# Patient Record
Sex: Female | Born: 1970 | Race: Black or African American | Hispanic: No | Marital: Single | Smoking: Never smoker
Health system: Southern US, Community
[De-identification: ages and names within clinical notes are randomized; demographics above are authoritative.]

## PROBLEM LIST (undated history)

## (undated) DIAGNOSIS — F419 Anxiety disorder, unspecified: Secondary | ICD-10-CM

## (undated) DIAGNOSIS — R519 Headache, unspecified: Secondary | ICD-10-CM

## (undated) DIAGNOSIS — N189 Chronic kidney disease, unspecified: Secondary | ICD-10-CM

## (undated) DIAGNOSIS — K219 Gastro-esophageal reflux disease without esophagitis: Secondary | ICD-10-CM

## (undated) DIAGNOSIS — G8929 Other chronic pain: Secondary | ICD-10-CM

## (undated) DIAGNOSIS — Z87442 Personal history of urinary calculi: Secondary | ICD-10-CM

## (undated) DIAGNOSIS — J45909 Unspecified asthma, uncomplicated: Secondary | ICD-10-CM

## (undated) DIAGNOSIS — G4730 Sleep apnea, unspecified: Secondary | ICD-10-CM

## (undated) DIAGNOSIS — R51 Headache: Secondary | ICD-10-CM

## (undated) DIAGNOSIS — D72819 Decreased white blood cell count, unspecified: Secondary | ICD-10-CM

## (undated) DIAGNOSIS — T7840XA Allergy, unspecified, initial encounter: Secondary | ICD-10-CM

## (undated) DIAGNOSIS — K5900 Constipation, unspecified: Secondary | ICD-10-CM

## (undated) HISTORY — DX: Constipation, unspecified: K59.00

## (undated) HISTORY — DX: Headache: R51

## (undated) HISTORY — DX: Allergy, unspecified, initial encounter: T78.40XA

## (undated) HISTORY — DX: Sleep apnea, unspecified: G47.30

## (undated) HISTORY — DX: Headache, unspecified: R51.9

## (undated) HISTORY — DX: Anxiety disorder, unspecified: F41.9

## (undated) HISTORY — DX: Other chronic pain: G89.29

## (undated) HISTORY — PX: OTHER SURGICAL HISTORY: SHX169

## (undated) HISTORY — DX: Chronic kidney disease, unspecified: N18.9

## (undated) HISTORY — DX: Decreased white blood cell count, unspecified: D72.819

## (undated) HISTORY — PX: ABDOMINAL HYSTERECTOMY: SHX81

## (undated) HISTORY — PX: COLONOSCOPY: SHX174

---

## 2011-07-28 ENCOUNTER — Encounter (HOSPITAL_COMMUNITY): Payer: Self-pay | Admitting: *Deleted

## 2011-07-28 ENCOUNTER — Emergency Department (HOSPITAL_COMMUNITY)
Admission: EM | Admit: 2011-07-28 | Discharge: 2011-07-29 | Disposition: A | Discharge status: Home / Self Care | Payer: Managed Care, Other (non HMO) | Attending: Emergency Medicine | Admitting: Emergency Medicine

## 2011-07-28 DIAGNOSIS — S139XXA Sprain of joints and ligaments of unspecified parts of neck, initial encounter: Secondary | ICD-10-CM | POA: Insufficient documentation

## 2011-07-28 DIAGNOSIS — S161XXA Strain of muscle, fascia and tendon at neck level, initial encounter: Secondary | ICD-10-CM

## 2011-07-28 DIAGNOSIS — M542 Cervicalgia: Secondary | ICD-10-CM | POA: Insufficient documentation
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
## 2011-07-29 ENCOUNTER — Emergency Department (HOSPITAL_COMMUNITY): Payer: Managed Care, Other (non HMO)

## 2011-07-29 MED ORDER — NAPROXEN 500 MG PO TABS: 500.0000 mg | ORAL_TABLET | Freq: Two times a day (BID) | ORAL | Status: DC

## 2011-07-29 MED ORDER — CYCLOBENZAPRINE HCL 10 MG PO TABS: 10.0000 mg | ORAL_TABLET | Freq: Three times a day (TID) | ORAL | Status: AC | PRN
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
## 2011-07-29 NOTE — ED Provider Notes (Signed)
Medical screening examination/treatment/procedure(s) were performed by non-physician practitioner and as supervising physician I was immediately available for consultation/collaboration.   Halie Gass B. Tya Haughey, MD 07/29/11 0657 

## 2011-09-16 ENCOUNTER — Emergency Department (HOSPITAL_COMMUNITY)
Admission: EM | Admit: 2011-09-16 | Discharge: 2011-09-16 | Disposition: A | Discharge status: Home / Self Care | Payer: Self-pay | Attending: Emergency Medicine | Admitting: Emergency Medicine

## 2011-09-16 ENCOUNTER — Encounter (HOSPITAL_COMMUNITY): Payer: Self-pay | Admitting: *Deleted

## 2011-09-16 DIAGNOSIS — K219 Gastro-esophageal reflux disease without esophagitis: Secondary | ICD-10-CM | POA: Insufficient documentation

## 2011-09-16 HISTORY — DX: Gastro-esophageal reflux disease without esophagitis: K21.9

## 2011-09-16 MED ORDER — ONDANSETRON 8 MG PO TBDP
8.0000 mg | ORAL_TABLET | Freq: Once | ORAL | Status: AC
  Administered 2011-09-16: 8 mg via ORAL
  Filled 2011-09-16: qty 1

## 2011-09-16 MED ORDER — PANTOPRAZOLE SODIUM 40 MG PO TBEC
40.0000 mg | DELAYED_RELEASE_TABLET | Freq: Every day | ORAL | Status: DC
  Administered 2011-09-16: 40 mg via ORAL
  Filled 2011-09-16: qty 1

## 2011-09-16 MED ORDER — FAMOTIDINE 20 MG PO TABS: 20.0000 mg | ORAL_TABLET | Freq: Two times a day (BID) | ORAL | Status: DC

## 2011-09-16 NOTE — ED Notes (Signed)
Pt states she used to take Prilosec for reflux, but hasn't taken it in a while.

## 2011-09-16 NOTE — ED Provider Notes (Signed)
History     CSN: [id number]  Arrival date & time 09/16/11  1814   First MD Initiated Contact with Patient 09/16/11 2120      Chief Complaint  Patient presents with  . Gastrophageal Reflux    (Consider location/radiation/quality/duration/timing/severity/associated sxs/prior treatment) HPI Comments: Melinda Freeman 41 y.o. female   The chief complaint is: Patient presents with:   Gastrophageal Reflux   The patient has medical history significant for:   Past Medical History:   GERD (gastroesophageal reflux disease)                       Patient presents with epigastric abdominal pain and one episode of blood tinged emesis. Patient states that she used to take Prilosec but stopped for monetary reasons. Patient states she take Tums frequently without relief. Denies fever or chills. Denies hematochezia, melena, or change in bowel pattern.        The history is provided by the patient.    Past Medical History  Diagnosis Date  . GERD (gastroesophageal reflux disease)     Past Surgical History  Procedure Date  . Abdominal hysterectomy     History reviewed. No pertinent family history.  History  Substance Use Topics  . Smoking status: Never Smoker   . Smokeless tobacco: Never Used  . Alcohol Use: Yes    OB History    Grav Para Term Preterm Abortions TAB SAB Ect Mult Living                  Review of Systems  Constitutional: Negative for fever and chills.  Gastrointestinal: Positive for nausea, vomiting and abdominal pain. Negative for blood in stool.  All other systems reviewed and are negative.    Allergies  Codeine; Hydrocodone; and Percocet  Home Medications   Current Outpatient Rx  Name Route Sig Dispense Refill  . ASPIRIN-ACETAMINOPHEN-CAFFEINE 250-250-65 MG PO TABS Oral Take 2 tablets by mouth every 6 (six) hours as needed. Headache    . OMEPRAZOLE 20 MG PO CPDR Oral Take 20 mg by mouth daily.      BP 127/68  Pulse 80  Temp 98 F (36.7 C)  (Oral)  Resp 18  SpO2 100%  Physical Exam  Nursing note and vitals reviewed. Constitutional: She appears well-developed and well-nourished. No distress.  HENT:  Head: Normocephalic and atraumatic.  Mouth/Throat: Oropharynx is clear and moist.  Eyes: Conjunctivae and EOM are normal. No scleral icterus.  Neck: Normal range of motion. Neck supple.  Abdominal: Soft. Bowel sounds are normal. There is tenderness.       Tenderness to palpation of the epigastrium.  Neurological: She is alert.  Skin: Skin is warm and dry.    ED Course  Procedures (including critical care time)  Labs Reviewed - No data to display No results found.   1. GERD (gastroesophageal reflux disease)       MDM  Patient presented for acute treatment of GERD. Patient given omeprazole in ED with improvement. Patient discharged on famotidine as per her request she wanted an Rx on the 4 dollar list at [facility]. Return precautions given verbally and in discharge summary. No red flags for peptic ulcer perforation. Patient encouraged to visit a GI specialist as she had an EGD in the past and may need further evaluation.        Pixie Casino, PA-C 09/17/11 [phone]

## 2011-09-16 NOTE — ED Notes (Signed)
Pt states reflux is getting worse. Pt states she tasted blood today. Pt states she had pinkish tinged sputum today.

## 2011-09-16 NOTE — ED Notes (Signed)
Pt presents to the ED with c/o of chronic acid reflux.  Pt reports epigastric pain that burns. Pt eating Doritos and drinking water prior to assessment. Pt states she vomited a tiny amount of blood today.

## 2011-09-20 NOTE — ED Provider Notes (Signed)
Medical screening examination/treatment/procedure(s) were performed by non-physician practitioner and as supervising physician I was immediately available for consultation/collaboration.  Yuvia Plant K Linker, MD 09/20/11 1719 

## 2012-09-19 ENCOUNTER — Emergency Department (INDEPENDENT_AMBULATORY_CARE_PROVIDER_SITE_OTHER)
Admission: EM | Admit: 2012-09-19 | Discharge: 2012-09-19 | Disposition: A | Discharge status: Home / Self Care | Payer: PRIVATE HEALTH INSURANCE | Source: Home / Self Care

## 2012-09-19 ENCOUNTER — Encounter (HOSPITAL_COMMUNITY): Payer: Self-pay | Admitting: Emergency Medicine

## 2012-09-19 DIAGNOSIS — T148 Other injury of unspecified body region: Secondary | ICD-10-CM

## 2012-09-19 DIAGNOSIS — W57XXXA Bitten or stung by nonvenomous insect and other nonvenomous arthropods, initial encounter: Secondary | ICD-10-CM

## 2012-09-19 MED ORDER — TRIAMCINOLONE ACETONIDE 0.1 % EX OINT: TOPICAL_OINTMENT | Freq: Two times a day (BID) | CUTANEOUS | Status: DC

## 2012-09-19 NOTE — ED Provider Notes (Signed)
CSN: [id number]     Arrival date & time 09/19/12  1638 History   First MD Initiated Contact with Patient 09/19/12 1723     Chief Complaint  Patient presents with  . Insect Bite    spider bite upper right thigh   (Consider location/radiation/quality/duration/timing/severity/associated sxs/prior Treatment) HPI Comments: 42 year old female presents complaining of  spider bite to her upper outer right thigh. 2 days ago, she saw a spider on her leg and she squished it. She did not immediately have any problems, but the next day she started to have some redness and itching in that area. Today, the redness has gone down some but the entire area is slightly swollen. Is completely nontender and is not painful at all. She has no fever, chills, or any problems elsewhere. No systemic symptoms, she feels fine and does not feel sick.   Past Medical History  Diagnosis Date  . GERD (gastroesophageal reflux disease)    Past Surgical History  Procedure Laterality Date  . Abdominal hysterectomy     History reviewed. No pertinent family history. History  Substance Use Topics  . Smoking status: Never Smoker   . Smokeless tobacco: Never Used  . Alcohol Use: Yes   OB History   Grav Para Term Preterm Abortions TAB SAB Ect Mult Living                 Review of Systems  Constitutional: Negative for fever and chills.  Eyes: Negative for visual disturbance.  Respiratory: Negative for cough and shortness of breath.   Cardiovascular: Negative for chest pain, palpitations and leg swelling.  Gastrointestinal: Negative for nausea, vomiting and abdominal pain.  Endocrine: Negative for polydipsia and polyuria.  Genitourinary: Negative for dysuria, urgency and frequency.  Musculoskeletal: Negative for myalgias and arthralgias.  Skin: Positive for rash (see history of present illness regarding lesion).  Neurological: Negative for dizziness, weakness and light-headedness.    Allergies  Codeine; Hydrocodone;  and Percocet  Home Medications   Current Outpatient Rx  Name  Route  Sig  Dispense  Refill  . aspirin-acetaminophen-caffeine (EXCEDRIN MIGRAINE) 250-250-65 MG per tablet   Oral   Take 2 tablets by mouth every 6 (six) hours as needed. Headache         . EXPIRED: famotidine (PEPCID) 20 MG tablet   Oral   Take 1 tablet (20 mg total) by mouth 2 (two) times daily.   30 tablet   0   . omeprazole (PRILOSEC) 20 MG capsule   Oral   Take 20 mg by mouth daily.         Marland Kitchen triamcinolone ointment (KENALOG) 0.1 %   Topical   Apply topically 2 (two) times daily.   30 g   0    BP 131/90  Pulse 80  Temp(Src) 98.9 F (37.2 C) (Oral)  Resp 16  SpO2 98% Physical Exam  Nursing note and vitals reviewed. Constitutional: She is oriented to person, place, and time. She appears well-developed and well-nourished. No distress.  HENT:  Head: Normocephalic and atraumatic.  Pulmonary/Chest: Effort normal. No respiratory distress.  Neurological: She is alert and oriented to person, place, and time. No cranial nerve deficit.  Skin: She is not diaphoretic.  Area of erythema and swelling on the upper outer right thigh, without induration or tenderness. Not warm to touch.  Psychiatric: She has a normal mood and affect. Judgment normal.    ED Course  Procedures (including critical care time) Labs Review Labs Reviewed - No  data to display Imaging Review No results found.  MDM   1. Insect bite    This is a local inflammatory reaction, I do not believe that this area is infected. She will take it daily allergy medicine such as Zyrtec and also triamcinolone ointment. Area outlined with a marker, she will keep an eye on it and return for any increasing pain or redness.  Meds ordered this encounter  Medications  . triamcinolone ointment (KENALOG) 0.1 %    Sig: Apply topically 2 (two) times daily.    Dispense:  30 g    Refill:  0     Graylon Good, PA-C 09/19/12 2041

## 2012-09-19 NOTE — ED Notes (Signed)
C/o spider bite to upper right thigh. Noticed on Saturday. Redness and swelling. Itching started today. Denies fever and drainage.  Pt has tried soaking in warm bath with no relief.

## 2012-09-21 NOTE — ED Provider Notes (Signed)
Medical screening examination/treatment/procedure(s) were performed by a resident physician or non-physician practitioner and as the supervising physician I was immediately available for consultation/collaboration.  Clementeen Graham, MD   Rodolph Bong, MD 09/21/12 [phone]

## 2013-02-19 ENCOUNTER — Ambulatory Visit (INDEPENDENT_AMBULATORY_CARE_PROVIDER_SITE_OTHER): Payer: BC Managed Care – PPO | Admitting: Obstetrics & Gynecology

## 2013-02-19 ENCOUNTER — Encounter: Payer: Self-pay | Admitting: Obstetrics & Gynecology

## 2013-02-19 VITALS — BP 132/89 | HR 76 | Temp 98.2°F | Ht 65.0 in | Wt 154.0 lb

## 2013-02-19 DIAGNOSIS — Z0000 Encounter for general adult medical examination without abnormal findings: Secondary | ICD-10-CM

## 2013-02-19 DIAGNOSIS — Z23 Encounter for immunization: Secondary | ICD-10-CM

## 2013-02-19 DIAGNOSIS — Z01419 Encounter for gynecological examination (general) (routine) without abnormal findings: Secondary | ICD-10-CM

## 2013-02-19 LAB — CBC WITH DIFFERENTIAL/PLATELET
BASOS ABS: 0.1 10*3/uL (ref 0.0–0.1)
Basophils Relative: 1 % (ref 0–1)
EOS PCT: 2 % (ref 0–5)
Eosinophils Absolute: 0.1 10*3/uL (ref 0.0–0.7)
HEMATOCRIT: 42.1 % (ref 36.0–46.0)
Hemoglobin: 14.3 g/dL (ref 12.0–15.0)
LYMPHS ABS: 2.2 10*3/uL (ref 0.7–4.0)
LYMPHS PCT: 36 % (ref 12–46)
MCH: 31.7 pg (ref 26.0–34.0)
MCHC: 34 g/dL (ref 30.0–36.0)
MCV: 93.3 fL (ref 78.0–100.0)
Monocytes Absolute: 0.5 10*3/uL (ref 0.1–1.0)
Monocytes Relative: 8 % (ref 3–12)
NEUTROS ABS: 3.2 10*3/uL (ref 1.7–7.7)
Neutrophils Relative %: 53 % (ref 43–77)
PLATELETS: 308 10*3/uL (ref 150–400)
RBC: 4.51 MIL/uL (ref 3.87–5.11)
RDW: 13.1 % (ref 11.5–15.5)
WBC: 6.1 10*3/uL (ref 4.0–10.5)

## 2013-02-19 NOTE — Progress Notes (Signed)
Subjective:     Melinda Freeman is a 43 y.o. female here for a routine exam.  Current complaints: Pt states that she feel as though she may have a sinus cold or allergies.  Pt states that she is having quite a lot of sinus drainage.  Pt states that she had a reaction after use of condom.  Pt states that she had irritation after use that gradually went away but has recurred.  Personal health questionnaire reviewed: yes.   Gynecologic History No LMP recorded. Patient has had a hysterectomy. Contraception: condoms Last Pap: 2013. Results were: normal Last mammogram: n/a  Obstetric History OB History  No data available     The following portions of the patient's history were reviewed and updated as appropriate: allergies, current medications, past family history, past medical history, past social history, past surgical history and problem list.  Review of Systems Pertinent items are noted in HPI.    Objective:   VULVA: normal appearing vulva with no masses, tenderness or lesions, VAGINA: normal appearing vagina with normal color and discharge, no lesions, UTERUS: surgically absent, ADNEXA: normal adnexa in size, nontender and no masses  Assessment:    Healthy female exam.    Plan:    CMET, CBC, Wet Prep and Tdap at today's visit Pt to return as needed

## 2013-02-20 ENCOUNTER — Other Ambulatory Visit: Payer: Self-pay | Admitting: Obstetrics & Gynecology

## 2013-02-20 ENCOUNTER — Other Ambulatory Visit: Payer: Self-pay

## 2013-02-20 DIAGNOSIS — Z1231 Encounter for screening mammogram for malignant neoplasm of breast: Secondary | ICD-10-CM

## 2013-02-20 LAB — COMPREHENSIVE METABOLIC PANEL
ALBUMIN: 4.5 g/dL (ref 3.5–5.2)
ALT: 14 U/L (ref 0–35)
AST: 21 U/L (ref 0–37)
Alkaline Phosphatase: 35 U/L — ABNORMAL LOW (ref 39–117)
BUN: 13 mg/dL (ref 6–23)
CALCIUM: 9.9 mg/dL (ref 8.4–10.5)
CHLORIDE: 99 meq/L (ref 96–112)
CO2: 26 meq/L (ref 19–32)
CREATININE: 0.85 mg/dL (ref 0.50–1.10)
Glucose, Bld: 81 mg/dL (ref 70–99)
POTASSIUM: 3.8 meq/L (ref 3.5–5.3)
SODIUM: 136 meq/L (ref 135–145)
TOTAL PROTEIN: 7.8 g/dL (ref 6.0–8.3)
Total Bilirubin: 0.4 mg/dL (ref 0.2–1.2)

## 2013-02-20 LAB — WET PREP BY MOLECULAR PROBE
CANDIDA SPECIES: POSITIVE — AB
Gardnerella vaginalis: NEGATIVE
Trichomonas vaginosis: NEGATIVE

## 2013-02-21 NOTE — Patient Instructions (Signed)

## 2013-03-10 ENCOUNTER — Telehealth: Payer: Self-pay | Admitting: *Deleted

## 2013-03-10 MED ORDER — FLUCONAZOLE 150 MG PO TABS: 150.0000 mg | ORAL_TABLET | Freq: Once | ORAL | Status: DC

## 2013-03-10 NOTE — Telephone Encounter (Signed)
Call placed to patient, no answer. A voice message was left informing patient of yeast infection and rx to pharmacy.  A voice message was left advising patient to call office if any questions.

## 2013-03-10 NOTE — Telephone Encounter (Signed)
Message copied by Jiles Garter on Sat Mar 10, 2013 12:10 PM ------      Message from: Lahoma Crocker      Created: Sun Feb 25, 2013  4:16 PM       Offer rx for candida ------

## 2013-05-21 ENCOUNTER — Other Ambulatory Visit: Payer: Self-pay | Admitting: *Deleted

## 2013-05-21 DIAGNOSIS — B3731 Acute candidiasis of vulva and vagina: Secondary | ICD-10-CM

## 2013-05-21 DIAGNOSIS — B373 Candidiasis of vulva and vagina: Secondary | ICD-10-CM

## 2013-05-21 MED ORDER — FLUCONAZOLE 150 MG PO TABS: 150.0000 mg | ORAL_TABLET | Freq: Once | ORAL | Status: DC

## 2013-06-07 ENCOUNTER — Ambulatory Visit (HOSPITAL_COMMUNITY)
Admission: RE | Admit: 2013-06-07 | Discharge: 2013-06-07 | Disposition: A | Discharge status: Home / Self Care | Payer: BC Managed Care – PPO | Source: Ambulatory Visit | Attending: Obstetrics & Gynecology | Admitting: Obstetrics & Gynecology

## 2013-06-07 DIAGNOSIS — Z1231 Encounter for screening mammogram for malignant neoplasm of breast: Secondary | ICD-10-CM | POA: Insufficient documentation

## 2013-06-08 ENCOUNTER — Other Ambulatory Visit: Payer: Self-pay | Admitting: Obstetrics & Gynecology

## 2013-06-08 DIAGNOSIS — R928 Other abnormal and inconclusive findings on diagnostic imaging of breast: Secondary | ICD-10-CM

## 2013-06-27 ENCOUNTER — Ambulatory Visit
Admission: RE | Admit: 2013-06-27 | Discharge: 2013-06-27 | Disposition: A | Discharge status: Home / Self Care | Payer: BC Managed Care – PPO | Source: Ambulatory Visit | Attending: Obstetrics & Gynecology | Admitting: Obstetrics & Gynecology

## 2013-06-27 DIAGNOSIS — R928 Other abnormal and inconclusive findings on diagnostic imaging of breast: Secondary | ICD-10-CM

## 2013-07-26 ENCOUNTER — Emergency Department (INDEPENDENT_AMBULATORY_CARE_PROVIDER_SITE_OTHER)
Admission: EM | Admit: 2013-07-26 | Discharge: 2013-07-26 | Disposition: A | Discharge status: Home / Self Care | Payer: BC Managed Care – PPO | Source: Home / Self Care | Attending: Emergency Medicine | Admitting: Emergency Medicine

## 2013-07-26 ENCOUNTER — Encounter (HOSPITAL_COMMUNITY): Payer: Self-pay | Admitting: Emergency Medicine

## 2013-07-26 DIAGNOSIS — H8110 Benign paroxysmal vertigo, unspecified ear: Secondary | ICD-10-CM

## 2013-07-26 DIAGNOSIS — H8111 Benign paroxysmal vertigo, right ear: Secondary | ICD-10-CM

## 2013-07-26 MED ORDER — MECLIZINE HCL 25 MG PO TABS: 25.0000 mg | ORAL_TABLET | Freq: Three times a day (TID) | ORAL | Status: DC | PRN

## 2013-07-26 NOTE — Discharge Instructions (Signed)
You have been diagnosed with benign positional vertigo.  The cause of this is a displaced particle or crystal in the inner ear.  This can be cured by doing the exercise described below, called the Epley exercise developed by Dr. Epley. ° °Do this exercise three times daily until you are able to do it for 24 hours without getting dizzy.  Sleep with your head elevated and try to avoid bending over.  If the dizziness is not better in 7 days, return here, see your primary care doctor or ENT doctor. ° ° ° ° ° °

## 2013-07-26 NOTE — ED Notes (Signed)
States she feel s dizzy, co-workers made remark to her that she was leaning to one side , NAD, denies n/v/d

## 2013-07-26 NOTE — ED Provider Notes (Signed)
  Chief Complaint   Chief Complaint  Patient presents with  . Dizziness    History of Present Illness   Melinda Freeman is a 43 year old female who has had a two-day history of a spinning vertigo. It is worse when she first gets up in the morning or with any movement of her head. It's better she lies completely still. She feels off balance and sometimes she feels like she's about to pass out. She's had some headache and her vision is intermittently blurred. When she stands or walks she tends to lean to the right. She's felt tired and rundown hasn't had much energy. She denies any difficulty hearing or ringing in the ears. Has been no diplopia. No URI symptoms. She denies chest pain or shortness of breath. She denies paresthesias, weakness, or difficulty with speech or swallowing.  Review of Systems   Other than noted above, the patient denies any of the following symptoms: Systemic:  No fever, chills, fatigue, or weight loss. Eye:  No blurred vision, visual change or diplopia. ENT:  No ear pain, tinnitus, hearing loss, nasal congestion, or rhinorrhea. Cardiac:  No chest pain, dyspnea, palpitations or syncope. Neuro:  No headache, paresthesias, weakness, trouble with speech, coordination or ambulation.  [place]   Past medical history, family history, social history, meds, and allergies were reviewed.    Physical Examination     Vital signs:  BP 123/81  Pulse 74  Temp(Src) 98.5 F (36.9 C) (Oral)  Resp 16  SpO2 98% General:  Alert, oriented times 3, in no distress. Eye:  PERRL, full EOM, no nystagmus. ENT:  TMs and canals normal.  Nasal mucosa normal.  Pharynx clear. Neck:  No adenopathy, tenderness, or mass.  Thyroid normal.  No carotid bruit. Lungs:  Breath sounds clear and equal bilaterally.  No wheezes, rales or rhonchi. Heart:  Regular rhythm.  No gallops, murmers, or rubs. Neuro:  Alert and oriented times 3.  Cranial nerves intact.  No pronator drift.  Finger to nose normal.  No  focal weakness.  Sensation intact to light touch.  Romberg's sign negative, gait normal.  Able to do tandem gait well.  Dix-Hallpike maneuver was positive with the right ear down.  Assessment   The encounter diagnosis was Benign positional vertigo, right.  Due to canalithiasis.  Plan     1.  Meds:  The following meds were prescribed:   Discharge Medication List as of 07/26/2013 10:15 AM    START taking these medications   Details  meclizine (ANTIVERT) 25 MG tablet Take 1 tablet (25 mg total) by mouth 3 (three) times daily as needed for dizziness., Starting 07/26/2013, Until Discontinued, Normal        2.  Patient Education/Counseling:  The patient was given appropriate handouts, self care instructions, and instructed in symptomatic relief.    3.  Follow up:  The patient was told to follow up here if no better in 3 to 4 days, or sooner if becoming worse in any way, and given some red flag symptoms such as new neurological symptoms, chest pain, or syncope which would prompt immediate return.       Harden Mo, MD 07/26/13 [phone]

## 2013-11-19 ENCOUNTER — Encounter (HOSPITAL_COMMUNITY): Payer: Self-pay | Admitting: Emergency Medicine

## 2014-01-14 ENCOUNTER — Encounter: Payer: Self-pay | Admitting: *Deleted

## 2014-01-15 ENCOUNTER — Encounter: Payer: Self-pay | Admitting: Obstetrics & Gynecology

## 2014-06-01 ENCOUNTER — Encounter (HOSPITAL_COMMUNITY): Payer: Self-pay | Admitting: Emergency Medicine

## 2014-06-01 ENCOUNTER — Emergency Department (INDEPENDENT_AMBULATORY_CARE_PROVIDER_SITE_OTHER)
Admission: EM | Admit: 2014-06-01 | Discharge: 2014-06-01 | Disposition: A | Discharge status: Home / Self Care | Payer: Self-pay | Source: Home / Self Care | Attending: Emergency Medicine | Admitting: Emergency Medicine

## 2014-06-01 DIAGNOSIS — R21 Rash and other nonspecific skin eruption: Secondary | ICD-10-CM

## 2014-06-01 MED ORDER — HYDROXYZINE HCL 25 MG PO TABS: 25.0000 mg | ORAL_TABLET | Freq: Three times a day (TID) | ORAL | Status: DC | PRN

## 2014-06-01 NOTE — ED Provider Notes (Signed)
CSN: [id number]     Arrival date & time 06/01/14  1114 History   First MD Initiated Contact with Patient 06/01/14 1239     No chief complaint on file.  (Consider location/radiation/quality/duration/timing/severity/associated sxs/prior Treatment) HPI Comments: States she has been awakened nightly for the past one year with the sensation that she it being bitten or stung by an insect and in the morning she always has several itchy lesions in variable locations on her body. No other members of the household have same issue. No pets in house. States she has replaced all mattresses and bed linens and thoroughly cleaned her apt and condition still seems to occur. Bites seem to occur in clusters of two or three  The history is provided by the patient.    Past Medical History  Diagnosis Date  . GERD (gastroesophageal reflux disease)    Past Surgical History  Procedure Laterality Date  . Abdominal hysterectomy    . Kidney stones     Family History  Problem Relation Age of Onset  . Hypertension Mother   . Hypertension Father    History  Substance Use Topics  . Smoking status: Never Smoker   . Smokeless tobacco: Never Used  . Alcohol Use: Yes   OB History    Gravida Para Term Preterm AB TAB SAB Ectopic Multiple Living   4 4 3 1      4      Review of Systems  All other systems reviewed and are negative.   Allergies  Codeine; Hydrocodone; and Percocet  Home Medications   Prior to Admission medications   Medication Sig Start Date End Date Taking? Authorizing Provider  aspirin-acetaminophen-caffeine (EXCEDRIN MIGRAINE) [phone] MG per tablet Take 2 tablets by mouth every 6 (six) hours as needed. Headache    Historical Provider, MD  famotidine (PEPCID) 20 MG tablet Take 1 tablet (20 mg total) by mouth 2 (two) times daily. 09/16/11 09/15/12  Tia L Oliveri, PA-C  fluconazole (DIFLUCAN) 150 MG tablet Take 1 tablet (150 mg total) by mouth once. 05/21/13   Shelly Bombard, MD  hydrOXYzine  (ATARAX/VISTARIL) 25 MG tablet Take 1 tablet (25 mg total) by mouth 3 (three) times daily as needed for itching. 06/01/14   Audelia Hives Presson, PA  meclizine (ANTIVERT) 25 MG tablet Take 1 tablet (25 mg total) by mouth 3 (three) times daily as needed for dizziness. 07/26/13   Harden Mo, MD  omeprazole (PRILOSEC) 20 MG capsule Take 20 mg by mouth daily.    Historical Provider, MD  triamcinolone ointment (KENALOG) 0.1 % Apply topically 2 (two) times daily. 09/19/12   Freeman Caldron Baker, PA-C   BP 122/73 mmHg  Pulse 79  Temp(Src) 99.4 F (37.4 C) (Oral)  Resp 16  SpO2 100% Physical Exam  Constitutional: She is oriented to person, place, and time. She appears well-developed and well-nourished. No distress.  HENT:  Head: Normocephalic and atraumatic.  Cardiovascular: Normal rate.   Pulmonary/Chest: Effort normal.  Neurological: She is alert and oriented to person, place, and time.  Skin: Skin is warm and dry.  Unfortunately, patient has very little in the way of active lesions that are available for examination today. Shows my some areas on her left thigh that are regions where recent lesions were and these consist of three small hyperpigmented macules in a linear distribution.   Psychiatric: She has a normal mood and affect. Her behavior is normal.  Nursing note and vitals reviewed.   ED Course  Procedures (including critical care time) Labs Review Labs Reviewed - No data to display  Imaging Review No results found.   MDM   1. Rash   Atarax as prescribed. Patient advised to have apartment inspected for bed bugs.     Lutricia Feil, [place] 06/01/14 [phone]

## 2014-06-01 NOTE — ED Notes (Signed)
C/o rash x1 year all over body; believes she may be getting bitten by insects Has changed her bed multiples times w/no relief Denise fevers, chill Alert, no signs of acute distress.

## 2014-06-01 NOTE — Discharge Instructions (Signed)
I think you need to have your apartment investigated for bed bugs.

## 2014-08-03 ENCOUNTER — Encounter (HOSPITAL_COMMUNITY): Payer: Self-pay | Admitting: Nurse Practitioner

## 2014-08-03 ENCOUNTER — Emergency Department (HOSPITAL_COMMUNITY): Payer: Self-pay

## 2014-08-03 ENCOUNTER — Emergency Department (HOSPITAL_COMMUNITY)
Admission: EM | Admit: 2014-08-03 | Discharge: 2014-08-03 | Disposition: A | Discharge status: Home / Self Care | Payer: Self-pay | Attending: Emergency Medicine | Admitting: Emergency Medicine

## 2014-08-03 DIAGNOSIS — J208 Acute bronchitis due to other specified organisms: Secondary | ICD-10-CM

## 2014-08-03 DIAGNOSIS — Z79899 Other long term (current) drug therapy: Secondary | ICD-10-CM | POA: Insufficient documentation

## 2014-08-03 DIAGNOSIS — R059 Cough, unspecified: Secondary | ICD-10-CM

## 2014-08-03 DIAGNOSIS — R05 Cough: Secondary | ICD-10-CM

## 2014-08-03 DIAGNOSIS — K219 Gastro-esophageal reflux disease without esophagitis: Secondary | ICD-10-CM | POA: Insufficient documentation

## 2014-08-03 DIAGNOSIS — Z7982 Long term (current) use of aspirin: Secondary | ICD-10-CM | POA: Insufficient documentation

## 2014-08-03 MED ORDER — BENZONATATE 100 MG PO CAPS: 100.0000 mg | ORAL_CAPSULE | Freq: Three times a day (TID) | ORAL | Status: DC

## 2014-08-03 MED ORDER — PREDNISONE 20 MG PO TABS: 20.0000 mg | ORAL_TABLET | Freq: Two times a day (BID) | ORAL | Status: DC

## 2014-08-03 MED ORDER — PREDNISONE 20 MG PO TABS
60.0000 mg | ORAL_TABLET | Freq: Once | ORAL | Status: AC
  Administered 2014-08-03: 60 mg via ORAL
  Filled 2014-08-03: qty 3

## 2014-08-03 MED ORDER — ALBUTEROL SULFATE HFA 108 (90 BASE) MCG/ACT IN AERS
1.0000 | INHALATION_SPRAY | Freq: Four times a day (QID) | RESPIRATORY_TRACT | Status: DC | PRN
  Administered 2014-08-03: 2 via RESPIRATORY_TRACT
  Filled 2014-08-03: qty 6.7

## 2014-08-03 MED ORDER — IBUPROFEN 400 MG PO TABS
800.0000 mg | ORAL_TABLET | Freq: Once | ORAL | Status: AC
  Administered 2014-08-03: 800 mg via ORAL
  Filled 2014-08-03: qty 2

## 2014-08-03 NOTE — ED Provider Notes (Signed)
CSN: [id number]     Arrival date & time 08/03/14  7829 History  This chart was scribed for Brent General, PA-C, working with Ripley Fraise, MD by Steva Colder, ED Scribe. The patient was seen in room TR05C/TR05C at 10:21 AM.     Chief Complaint  Patient presents with  . Cough      The history is provided by the patient. No language interpreter was used.    Melinda Freeman is a 44 y.o. female with a medical hx of GERD who presents to the Emergency Department complaining of dry cough onset 1 month. Pt reports that one morning she woke up with the cough. Pt denies any new medications or daily medications. Pt denies asthma or allergies hx.   She notes that she has tried OTC medications with no relief of her symptoms. She reports over the past several days she's had an associated discomfort in her chest which she describes as sharp pain that is only present while coughing.  She denies congestion, sore throat, fever, sneezing, SOB, nausea, vomiting, headache, blurred vision, dizziness, weakness.   Past Medical History  Diagnosis Date  . GERD (gastroesophageal reflux disease)    Past Surgical History  Procedure Laterality Date  . Abdominal hysterectomy    . Kidney stones     Family History  Problem Relation Age of Onset  . Hypertension Mother   . Hypertension Father    History  Substance Use Topics  . Smoking status: Never Smoker   . Smokeless tobacco: Never Used  . Alcohol Use: No   OB History    Gravida Para Term Preterm AB TAB SAB Ectopic Multiple Living   4 4 3 1      4      Review of Systems  Constitutional: Negative for fever and chills.  HENT: Negative for congestion, rhinorrhea, sneezing and sore throat.   Respiratory: Positive for cough. Negative for shortness of breath.       Allergies  Codeine; Hydrocodone; and Percocet  Home Medications   Prior to Admission medications   Medication Sig Start Date End Date Taking? Authorizing Provider   aspirin-acetaminophen-caffeine (EXCEDRIN MIGRAINE) [phone] MG per tablet Take 2 tablets by mouth every 6 (six) hours as needed. Headache    Historical Provider, MD  benzonatate (TESSALON) 100 MG capsule Take 1 capsule (100 mg total) by mouth every 8 (eight) hours. 08/03/14   Dahlia Bailiff, PA-C  famotidine (PEPCID) 20 MG tablet Take 1 tablet (20 mg total) by mouth 2 (two) times daily. 09/16/11 09/15/12  Tia L Oliveri, PA-C  fluconazole (DIFLUCAN) 150 MG tablet Take 1 tablet (150 mg total) by mouth once. 05/21/13   Shelly Bombard, MD  hydrOXYzine (ATARAX/VISTARIL) 25 MG tablet Take 1 tablet (25 mg total) by mouth 3 (three) times daily as needed for itching. 06/01/14   Audelia Hives Presson, PA  meclizine (ANTIVERT) 25 MG tablet Take 1 tablet (25 mg total) by mouth 3 (three) times daily as needed for dizziness. 07/26/13   Harden Mo, MD  omeprazole (PRILOSEC) 20 MG capsule Take 20 mg by mouth daily.    Historical Provider, MD  predniSONE (DELTASONE) 20 MG tablet Take 1 tablet (20 mg total) by mouth 2 (two) times daily with a meal. 08/03/14   Dahlia Bailiff, PA-C  triamcinolone ointment (KENALOG) 0.1 % Apply topically 2 (two) times daily. 09/19/12   Freeman Caldron Baker, PA-C   BP 121/75 mmHg  Pulse 73  Temp(Src) 98 F (36.7 C)  Resp  16  Ht 5' 4.5" (1.638 m)  Wt 155 lb (70.308 kg)  BMI 26.20 kg/m2  SpO2 100% Physical Exam  Constitutional: She is oriented to person, place, and time. She appears well-developed and well-nourished. No distress.  HENT:  Head: Normocephalic and atraumatic.  Eyes: EOM are normal.  Neck: Neck supple. No tracheal deviation present.  Cardiovascular: Normal rate, regular rhythm and normal heart sounds.  Exam reveals no gallop and no friction rub.   No murmur heard. Pulmonary/Chest: Effort normal and breath sounds normal. No respiratory distress. She has no wheezes. She has no rales.  Lungs clear to auscultation bilaterally.  Abdominal: Soft. There is no tenderness.   Musculoskeletal: Normal range of motion.  Neurological: She is alert and oriented to person, place, and time.  Skin: Skin is warm and dry.  Psychiatric: She has a normal mood and affect. Her behavior is normal.  Nursing note and vitals reviewed.   ED Course  Procedures (including critical care time) DIAGNOSTIC STUDIES: Oxygen Saturation is 100% on RA, nl by my interpretation.    COORDINATION OF CARE: 10:25 AM-Discussed treatment plan which includes CXR, prednisone, and inhaler with pt at bedside and pt agreed to plan.   Labs Review Labs Reviewed - No data to display  Imaging Review Dg Chest 2 View  08/03/2014   CLINICAL DATA:  Pt complains of a dry, deep cough x 1 month. Pt is not coughing anything up, denies fever, CP, or SOB.  EXAM: CHEST  2 VIEW  COMPARISON:  None.  FINDINGS: The heart size and mediastinal contours are within normal limits. Both lungs are clear. No pleural effusion or pneumothorax. The visualized skeletal structures are unremarkable.  IMPRESSION: No active cardiopulmonary disease.   Electronically Signed   By: Lajean Manes M.D.   On: 08/03/2014 11:01     EKG Interpretation None      MDM   Final diagnoses:  Cough  Acute viral bronchitis    Patient here signs and symptoms consistent with a viral bronchitis. Radiographs unremarkable for acute pathology. Patient given albuterol inhaler, prednisone therapy for ongoing symptoms greater than a month. Patient is afebrile, hemodynamically stable and in no acute distress. No tachypnea, hypoxia, tachycardia. No concern for ACS. Patient does have some atypical, mild chest discomfort only when coughing which improved after Motrin and albuterol inhaler. No concern for PE. Patient is PERC negative.  Patient stable for discharge, strongly encouraged patient to follow up with PCP, resource guide provided. Return precautions discussed, patient verbalizes understanding and agreement of this plan.  I personally performed the  services described in this documentation, which was scribed in my presence. The recorded information has been reviewed and is accurate.  BP 121/75 mmHg  Pulse 73  Temp(Src) 98 F (36.7 C)  Resp 16  Ht 5' 4.5" (1.638 m)  Wt 155 lb (70.308 kg)  BMI 26.20 kg/m2  SpO2 100%  Signed,  Dahlia Bailiff, PA-C 6:18 PM    Dahlia Bailiff, PA-C 08/03/14 1818  Ripley Fraise, MD 08/04/14 1009

## 2014-08-03 NOTE — ED Notes (Signed)
Declined W/C at D/C and was escorted to lobby by RN. 

## 2014-08-03 NOTE — Discharge Instructions (Signed)
Acute Bronchitis Bronchitis is inflammation of the airways that extend from the windpipe into the lungs (bronchi). The inflammation often causes mucus to develop. This leads to a cough, which is the most common symptom of bronchitis.  In acute bronchitis, the condition usually develops suddenly and goes away over time, usually in a couple weeks. Smoking, allergies, and asthma can make bronchitis worse. Repeated episodes of bronchitis may cause further lung problems.  CAUSES Acute bronchitis is most often caused by the same virus that causes a cold. The virus can spread from person to person (contagious) through coughing, sneezing, and touching contaminated objects. SIGNS AND SYMPTOMS   Cough.   Fever.   Coughing up mucus.   Body aches.   Chest congestion.   Chills.   Shortness of breath.   Sore throat.  DIAGNOSIS  Acute bronchitis is usually diagnosed through a physical exam. Your health care provider will also ask you questions about your medical history. Tests, such as chest X-rays, are sometimes done to rule out other conditions.  TREATMENT  Acute bronchitis usually goes away in a couple weeks. Oftentimes, no medical treatment is necessary. Medicines are sometimes given for relief of fever or cough. Antibiotic medicines are usually not needed but may be prescribed in certain situations. In some cases, an inhaler may be recommended to help reduce shortness of breath and control the cough. A cool mist vaporizer may also be used to help thin bronchial secretions and make it easier to clear the chest.  HOME CARE INSTRUCTIONS  Get plenty of rest.   Drink enough fluids to keep your urine clear or pale yellow (unless you have a medical condition that requires fluid restriction). Increasing fluids may help thin your respiratory secretions (sputum) and reduce chest congestion, and it will prevent dehydration.   Take medicines only as directed by your health care provider.  If  you were prescribed an antibiotic medicine, finish it all even if you start to feel better.  Avoid smoking and secondhand smoke. Exposure to cigarette smoke or irritating chemicals will make bronchitis worse. If you are a smoker, consider using nicotine gum or skin patches to help control withdrawal symptoms. Quitting smoking will help your lungs heal faster.   Reduce the chances of another bout of acute bronchitis by washing your hands frequently, avoiding people with cold symptoms, and trying not to touch your hands to your mouth, nose, or eyes.   Keep all follow-up visits as directed by your health care provider.  SEEK MEDICAL CARE IF: Your symptoms do not improve after 1 week of treatment.  SEEK IMMEDIATE MEDICAL CARE IF:  You develop an increased fever or chills.   You have chest pain.   You have severe shortness of breath.  You have bloody sputum.   You develop dehydration.  You faint or repeatedly feel like you are going to pass out.  You develop repeated vomiting.  You develop a severe headache. MAKE SURE YOU:   Understand these instructions.  Will watch your condition.  Will get help right away if you are not doing well or get worse. Document Released: 02/12/2004 Document Revised: 05/21/2013 Document Reviewed: 06/27/2012 [facility] Patient Information 2015 [place], [place]. This information is not intended to replace advice given to you by your health care provider. Make sure you discuss any questions you have with your health care provider.   Emergency Department Resource Guide 1) Find a Doctor and Pay Out of Pocket Although you won't have to find out who is  covered by your insurance plan, it is a good idea to ask around and get recommendations. You will then need to call the office and see if the doctor you have chosen will accept you as a new patient and what types of options they offer for patients who are self-pay. Some doctors offer discounts or will set up  payment plans for their patients who do not have insurance, but you will need to ask so you aren't surprised when you get to your appointment.  2) Contact Your Local Health Department Not all health departments have doctors that can see patients for sick visits, but many do, so it is worth a call to see if yours does. If you don't know where your local health department is, you can check in your phone book. The CDC also has a tool to help you locate your state's health department, and many state websites also have listings of all of their local health departments.  3) Find a [facility] If your illness is not likely to be very severe or complicated, you may want to try a walk in clinic. These are popping up all over the country in pharmacies, drugstores, and shopping centers. They're usually staffed by nurse practitioners or physician assistants that have been trained to treat common illnesses and complaints. They're usually fairly quick and inexpensive. However, if you have serious medical issues or chronic medical problems, these are probably not your best option.  No Primary Care Doctor: - Call Health Connect at  [phone] - they can help you locate a primary care doctor that  accepts your insurance, provides certain services, etc. - Physician Referral Service- [phone]  Chronic Pain Problems: Organization         Address  Phone   Notes  [facility]  [phone] Patients need to be referred by their primary care doctor.   Medication Assistance: Organization         Address  Phone   Notes  [facility] Medication [facility] [address] [place], [place] [address] [phone] --Must be a resident of [facility] -- Must have NO insurance coverage whatsoever (no Medicaid/ Medicare, etc.) -- The pt. MUST have a primary care doctor that directs their care regularly and follows them in the community   MedAssist  [phone]   [employer]  [phone]    Agencies that provide inexpensive medical care: Organization         Address  Phone   Notes  [place]  [phone]   [facility]    [phone]   [facility] [place], [place] [address] [phone]   [place] [address], [place] [phone]   Planned Parenthood    [phone]   [facility]    [phone]   [place] and [place], [place] Phone:  [phone], Fax:  [phone] Hours of Operation:  9 am - 6 pm, M-F.  Also accepts Medicaid/Medicare and self-pay.  [facility] for [place], [address], [place] Phone: [phone], Fax: [phone]. Hours of Operation:  8:30 am - 5:30 pm, M-F.  Also accepts Medicaid and self-pay.  [facility] [address], [place] Phone: [phone]   [place], [place], [place] [phone], Ext.  123 Mondays & Thursdays: 7-9 AM.  First 15 patients are seen on a first come, first serve basis.    [place] Providers:  Organization         Address  Phone   Notes  [facility] [address], [place] [phone] Also accepts self-pay patients.  [facility] [address], [place]  [phone]   [place], [address], [place] [phone]   [facility] [address] [place] [phone]   Lucianne Lei [address], [place]   [phone] Only accepts [place] Access [place] patients after they have their name applied to their card.   Self-Pay (no insurance) in [facility]:  Organization         Address  Phone   Notes  Sickle Cell Patients, [facility] [place]  [phone]   [facility] [place] [phone]   [facility] [place]  [place], [place],  [phone]   [facility]/Dr. Osei-Bonsu  [address], [place] or [address], [place] [phone] Phone number for both [place] and [place] locations is the same.  Urgent Medical and [facility] [address], [place] [phone]   [facility] [address] [place] or [address] [phone]   [facility] [address] [place] [phone], phone; [phone], fax Sees patients 1st and 3rd Saturday of every month.  Must not qualify for public or private insurance (i.e. Medicaid, Medicare, [place] Health Choice, Veterans' Benefits)  Household income should be no more than 200% of the poverty level The clinic cannot treat you if you are pregnant or think you are pregnant  Sexually transmitted diseases are not treated at the clinic.    Dental Care: Organization         Address  Phone  Notes  [facility] Department of [facility] [phone] Accepts children up to age 22 who are enrolled in [place] or [place]; pregnant women with a Medicaid card; and children who have applied for Medicaid or [place] Health Choice, but were declined, whose parents can pay a reduced fee at time of service.  [facility] Department of [facility]  [address], [place] [phone] Accepts children up to age 67 who are enrolled in [place] or [place]; pregnant women with a Medicaid card; and children who have applied for Medicaid or  Health Choice, but were declined, whose parents can pay a reduced fee at time of service.  [place] Adult Dental Access PROGRAM  [place] [phone] Patients  are seen by appointment only. Walk-ins are not accepted. [place] will see patients 69 years of age and older. Monday - Tuesday (8am-5pm) Most Wednesdays (8:30-5pm) $30 per visit, cash only  [facility] Adult Dental Access PROGRAM  [address], [place] [facility] [phone] Patients are seen by appointment only. Walk-ins are not accepted. [place] will see patients 25 years of age and older. One Wednesday Evening (Monthly: Volunteer Based).  $30 per visit, cash only  [place]  [phone] for  adults; Children under age 32, call Graduate Pediatric Dentistry at [phone]. Children aged 74-14, please call [phone] to request a pediatric application.  Dental services are provided in all areas of dental care including fillings, crowns and bridges, complete and partial dentures, implants, gum treatment, root canals, and extractions. Preventive care is also provided. Treatment is provided to both adults and children. Patients are selected via a lottery and there is often a waiting list.   [facility] [address] [place]  [phone] [email or web address]   [facility] [address] [place], [place] [phone], Ext. [phone] Second and Fourth Thursday of each month, opens at 6:30 AM; Clinic ends at 9 AM.  Patients are seen on a first-come first-served basis, and a limited number are seen during each clinic.   [facility]  [address] [place], [place] [phone]   Eligibility Requirements You must have lived in [place], [place], or [place] counties for at least the last three months.   You cannot be eligible for state or federal sponsored [employer], including [employer], [place], or [employer].   You generally cannot be eligible for healthcare insurance through your employer.    How to apply: Eligibility screenings are held every Tuesday and Wednesday afternoon from 1:00 pm until  4:00 pm. You do not need an appointment for the interview!  [facility] [address], [place], [place]   [place]  [place] Department  [place]  [phone]    Behavioral Health Resources in the Community: Intensive Outpatient Programs Organization         Address  Phone  Notes  [place]. [address], [place], [place] [phone]   [facility] [address] [place], [place]   ADS: [facility] [address], [place], [place]   [place] [address],  [place], [place] or [phone]   Substance Abuse Resources Organization         Address  Phone  Notes  Alcohol and Drug Services  [phone]   [place]  [phone]   [facility] [place]   [place]inita Pester  [phone]   Residential & Outpatient Substance Abuse Program  [phone]   Psychological Services Organization         Address  Phone  Notes  [facility] [place]  [place]  [phone]   [place] [address], [place] or [phone]    Mobile Crisis Teams Organization         Address  Phone  Notes  [facility]rapeutic Alternatives, Mobile Crisis Care Unit  [phone]   Assertive Psychotherapeutic Services  [address] [place], [place]   [place] Levels [address] [place] [phone]    Self-Help/Support Groups Organization         Address  Phone             Notes  [place]. of [place] - variety of support groups  [place]rent Call for more information  Narcotics Anonymous (NA), [facility]aring [facility] [address], [employer] [place]  2 meetings at this location   Brewing technologist  Notes  ASAP  Residential Treatment [address],    [place]  [phone]   [facility]  [address] [place] [address], [place], [place]   [place], [place] [phone] Admissions: 8am-3pm M-F  Incentives Substance [place] [address]    [place], [place] [phone]   [facility] [address] [place], [place], [place]   [facility] [address],  [place], [place]   [facility]ight Programs - Intensive Outpatient [address] [place], [place], [place]   [facility] ([place] [address]  [place], [place] [phone] or [phone]   [facility] Services (RTS) [address], [place], [place] Accepts Medicaid  [facility] [place] [address],  [place] [phone] Substance Abuse/Addiction Treatment   [facility] Organization         Address  Phone  Notes  [facility]  [phone]   Domenic Schwab, PhD [address] Arlis [place], [place]   [phone] or [phone]   [place], [place] [phone]   [facility] [address], [place], [place] [phone] Insurance/Medicaid/sponsorship through [facility] and Families [address], [address]                                    [place], [place] [phone] [place] [address], [place] [phone]    Dr. Adele Schilder  [phone]   [facility] Dept. 1) [address] [place] 2) [place] 3)  [place] [address], [place] [phone]  [phone]   [place] [phone] or [phone] (After Hours)

## 2014-08-03 NOTE — ED Notes (Addendum)
She c/o "hard, dry" cough x 1 month. She denies any nasal congestion, sore throat, fevers, weight loss, sweats. She has tried OTC cough meds with no relief. She denies pain. She is breathing easily

## 2015-06-06 ENCOUNTER — Ambulatory Visit: Payer: Self-pay | Admitting: Certified Nurse Midwife

## 2015-06-13 ENCOUNTER — Ambulatory Visit: Payer: Self-pay | Admitting: Certified Nurse Midwife

## 2015-06-20 ENCOUNTER — Encounter: Payer: Self-pay | Admitting: Gastroenterology

## 2015-06-20 ENCOUNTER — Ambulatory Visit (INDEPENDENT_AMBULATORY_CARE_PROVIDER_SITE_OTHER): Payer: Commercial Managed Care - PPO | Admitting: Certified Nurse Midwife

## 2015-06-20 ENCOUNTER — Encounter: Payer: Self-pay | Admitting: Certified Nurse Midwife

## 2015-06-20 VITALS — BP 139/95 | HR 64 | Wt 160.0 lb

## 2015-06-20 DIAGNOSIS — Z01419 Encounter for gynecological examination (general) (routine) without abnormal findings: Secondary | ICD-10-CM | POA: Diagnosis not present

## 2015-06-20 DIAGNOSIS — R030 Elevated blood-pressure reading, without diagnosis of hypertension: Principal | ICD-10-CM

## 2015-06-20 DIAGNOSIS — K5909 Other constipation: Secondary | ICD-10-CM

## 2015-06-20 DIAGNOSIS — Z800 Family history of malignant neoplasm of digestive organs: Secondary | ICD-10-CM

## 2015-06-20 DIAGNOSIS — IMO0001 Reserved for inherently not codable concepts without codable children: Secondary | ICD-10-CM | POA: Insufficient documentation

## 2015-06-20 DIAGNOSIS — K5901 Slow transit constipation: Secondary | ICD-10-CM

## 2015-06-20 MED ORDER — POLYETHYLENE GLYCOL 3350 17 GM/SCOOP PO POWD: 17.0000 g | Freq: Every day | ORAL | Status: AC

## 2015-06-20 NOTE — Progress Notes (Signed)
Patient ID: Melinda Freeman, female   DOB: 26-May-1970, 45 y.o.   MRN: [id number]    Subjective:      Melinda Freeman is a 45 y.o. female here for a routine exam.  Current complaints: none.  Had a hysterectomy in 2008.  Sexually active.  Has 5 children.  Reports having elevated blood pressures for a few years.   Desires full STD screening and well woman exam with blood work for her job.  Very pleasant woman.  Here for exam with her youngest daughter.  States that she has not had an exam in several years.  Does not have a primary care provider.  Hysterectomy was performed for AUB, not for cancer.    Personal health questionnaire:  Is patient Ashkenazi Jewish, have a family history of breast and/or ovarian cancer: no Is there a family history of uterine cancer diagnosed at age < 68, gastrointestinal cancer, urinary tract cancer, family member who is a Field seismologist syndrome-associated carrier: yes maternal aunt colon cancer Is the patient overweight and hypertensive, family history of diabetes, personal history of gestational diabetes, preeclampsia or PCOS: no Is patient over 37, have PCOS,  family history of premature CHD under age 58, diabetes, smoke, have hypertension or peripheral artery disease:  yes At any time, has a partner hit, kicked or otherwise hurt or frightened you?: no Over the past 2 weeks, have you felt down, depressed or hopeless?: no Over the past 2 weeks, have you felt little interest or pleasure in doing things?:no   Gynecologic History No LMP recorded. Patient has had a hysterectomy. Contraception: status post hysterectomy Last Pap: 2015. Results were: normal Last mammogram: 2015. Results were: normal  Obstetric History OB History  Gravida Para Term Preterm AB SAB TAB Ectopic Multiple Living  4 4 3 1      4     # Outcome Date GA Lbr Len/2nd Weight Sex Delivery Anes PTL Lv  4 Preterm 10/03/96 [redacted]w[redacted]d   F Vag-Spont None  Y  3 Term 10/31/91 [redacted]w[redacted]d   M Vag-Spont None  Y  2 Term 12/02/90  [redacted]w[redacted]d   M Vag-Spont EPI  Y  1 Term 09/27/89 [redacted]w[redacted]d   M Vag-Spont EPI  Y      Past Medical History  Diagnosis Date  . GERD (gastroesophageal reflux disease)     Past Surgical History  Procedure Laterality Date  . Abdominal hysterectomy    . Kidney stones       Current outpatient prescriptions:  .  aspirin-acetaminophen-caffeine (EXCEDRIN MIGRAINE) [id number] MG per tablet, Take 2 tablets by mouth every 6 (six) hours as needed. Headache, Disp: , Rfl:  .  famotidine (PEPCID) 20 MG tablet, Take 1 tablet (20 mg total) by mouth 2 (two) times daily., Disp: 30 tablet, Rfl: 0 .  hydrOXYzine (ATARAX/VISTARIL) 25 MG tablet, Take 1 tablet (25 mg total) by mouth 3 (three) times daily as needed for itching., Disp: 30 tablet, Rfl: 0 .  omeprazole (PRILOSEC) 20 MG capsule, Take 20 mg by mouth daily., Disp: , Rfl:  .  polyethylene glycol powder (GLYCOLAX/MIRALAX) powder, Take 17 g by mouth daily., Disp: 500 g, Rfl: 0 Allergies  Allergen Reactions  . Codeine     Fainting   . Hydrocodone Swelling  . Percocet [Oxycodone-Acetaminophen] Swelling    Social History  Substance Use Topics  . Smoking status: Never Smoker   . Smokeless tobacco: Never Used  . Alcohol Use: No    Family History  Problem Relation Age of Onset  .  Hypertension Mother   . Hypertension Father       Review of Systems  Constitutional: negative for fatigue and weight loss Respiratory: negative for cough and wheezing Cardiovascular: negative for chest pain, fatigue and palpitations Gastrointestinal: negative for abdominal pain and change in bowel habits Musculoskeletal:negative for myalgias Neurological: negative for gait problems and tremors Behavioral/Psych: negative for abusive relationship, depression Endocrine: negative for temperature intolerance   Genitourinary:negative for abnormal menstrual periods, genital lesions, hot flashes, sexual problems and vaginal discharge Integument/breast: negative for breast lump,  breast tenderness, nipple discharge and skin lesion(s)    Objective:       BP 139/95 mmHg  Pulse 64  Wt 160 lb (72.576 kg) General:   alert  Skin:   no rash or abnormalities  Lungs:   clear to auscultation bilaterally  Heart:   regular rate and rhythm, S1, S2 normal, no murmur, click, rub or gallop  Breasts:   normal without suspicious masses, skin or nipple changes or axillary nodes  Abdomen:  normal findings: no organomegaly, soft, non-tender and no hernia  Pelvis:  External genitalia: normal general appearance Urinary system: urethral meatus normal and bladder without fullness, nontender Vaginal: normal without tenderness, induration or masses Cervix: surgically absent Adnexa: surgically absent Uterus: surgically absent   Lab Review Urine pregnancy test Labs reviewed yes Radiologic studies reviewed yes  50% of 30 min visit spent on counseling and coordination of care.   Assessment:    Healthy female exam.   Chronic constipation  H/O colon cancer in 1st deg relative  S/P hysterectomy  STD screening exam  elevated blood pressure in office  Plan:    Education reviewed: calcium supplements, depression evaluation, low fat, low cholesterol diet, safe sex/STD prevention, self breast exams, skin cancer screening and weight bearing exercise. Contraception: status post hysterectomy. Mammogram ordered. Follow up in: 1 year.   Meds ordered this encounter  Medications  . polyethylene glycol powder (GLYCOLAX/MIRALAX) powder    Sig: Take 17 g by mouth daily.    Dispense:  500 g    Refill:  0   Orders Placed This Encounter  Procedures  . MM DIGITAL SCREENING BILATERAL    Standing Status: Future     Number of Occurrences:      Standing Expiration Date: 08/19/2016    Order Specific Question:  Reason for Exam (SYMPTOM  OR DIAGNOSIS REQUIRED)    Answer:  screening exam    Order Specific Question:  Is the patient pregnant?    Answer:  No    Order Specific Question:   Preferred imaging location?    Answer:  [facility]  . CMP14+CBC/D/Plt+TSH  . Cholesterol, total  . Triglycerides  . HDL cholesterol  . Hepatitis B surface antigen  . RPR  . Hepatitis C antibody  . HIV antibody  . Ambulatory referral to Gastroenterology    Referral Priority:  Routine    Referral Type:  Consultation    Referral Reason:  Specialty Services Required    Number of Visits Requested:  1  . Ambulatory referral to Internal Medicine    Referral Priority:  Routine    Referral Type:  Consultation    Referral Reason:  Specialty Services Required    Requested Specialty:  Internal Medicine    Number of Visits Requested:  1   Need to obtain previous records Possible management options include: genetics counseling if other family members have a hx of cancers Follow up as needed.

## 2015-06-21 LAB — CMP14+CBC/D/PLT+TSH
ALK PHOS: 30 IU/L — AB (ref 39–117)
ALT: 16 IU/L (ref 0–32)
AST: 23 IU/L (ref 0–40)
Albumin/Globulin Ratio: 1.4 (ref 1.2–2.2)
Albumin: 4.4 g/dL (ref 3.5–5.5)
BASOS: 1 %
BUN / CREAT RATIO: 13 (ref 9–23)
BUN: 12 mg/dL (ref 6–24)
Basophils Absolute: 0.1 10*3/uL (ref 0.0–0.2)
CO2: 21 mmol/L (ref 18–29)
Calcium: 9.1 mg/dL (ref 8.7–10.2)
Chloride: 98 mmol/L (ref 96–106)
Creatinine, Ser: 0.9 mg/dL (ref 0.57–1.00)
EOS (ABSOLUTE): 0.1 10*3/uL (ref 0.0–0.4)
Eos: 3 %
GFR calc Af Amer: 91 mL/min/{1.73_m2} (ref >59)
GFR calc non Af Amer: 79 mL/min/{1.73_m2} (ref >59)
GLUCOSE: 90 mg/dL (ref 65–99)
Globulin, Total: 3.2 g/dL (ref 1.5–4.5)
HEMOGLOBIN: 12.5 g/dL (ref 11.1–15.9)
Hematocrit: 38.6 % (ref 34.0–46.6)
Immature Grans (Abs): 0 10*3/uL (ref 0.0–0.1)
Immature Granulocytes: 0 %
Lymphocytes Absolute: 1.9 10*3/uL (ref 0.7–3.1)
Lymphs: 38 %
MCH: 30.3 pg (ref 26.6–33.0)
MCHC: 32.4 g/dL (ref 31.5–35.7)
MCV: 94 fL (ref 79–97)
MONOS ABS: 0.4 10*3/uL (ref 0.1–0.9)
Monocytes: 7 %
NEUTROS ABS: 2.5 10*3/uL (ref 1.4–7.0)
Neutrophils: 51 %
Platelets: 313 10*3/uL (ref 150–379)
Potassium: 4.2 mmol/L (ref 3.5–5.2)
RBC: 4.13 x10E6/uL (ref 3.77–5.28)
RDW: 13.3 % (ref 12.3–15.4)
Sodium: 137 mmol/L (ref 134–144)
TSH: 1.75 u[IU]/mL (ref 0.450–4.500)
Total Protein: 7.6 g/dL (ref 6.0–8.5)
WBC: 5 10*3/uL (ref 3.4–10.8)

## 2015-06-21 LAB — TRIGLYCERIDES: Triglycerides: 125 mg/dL (ref 0–149)

## 2015-06-21 LAB — HIV ANTIBODY (ROUTINE TESTING W REFLEX): HIV SCREEN 4TH GENERATION: NONREACTIVE

## 2015-06-21 LAB — RPR: RPR: NONREACTIVE

## 2015-06-21 LAB — HEPATITIS B SURFACE ANTIGEN: HEP B S AG: NEGATIVE

## 2015-06-21 LAB — HDL CHOLESTEROL: HDL: 77 mg/dL (ref >39)

## 2015-06-21 LAB — CHOLESTEROL, TOTAL: Cholesterol, Total: 220 mg/dL — ABNORMAL HIGH (ref 100–199)

## 2015-06-21 LAB — HEPATITIS C ANTIBODY: Hep C Virus Ab: <0.1 s/co ratio (ref 0.0–0.9)

## 2015-06-24 LAB — PAP IG (IMAGE GUIDED): PAP Smear Comment: 0

## 2015-06-25 ENCOUNTER — Other Ambulatory Visit: Payer: Self-pay | Admitting: Certified Nurse Midwife

## 2015-06-25 DIAGNOSIS — A599 Trichomoniasis, unspecified: Secondary | ICD-10-CM

## 2015-06-25 LAB — NUSWAB VG, CANDIDA 6SP
CANDIDA ALBICANS, NAA: NEGATIVE
CANDIDA GLABRATA, NAA: NEGATIVE
CANDIDA LUSITANIAE, NAA: NEGATIVE
CANDIDA PARAPSILOSIS, NAA: NEGATIVE
Candida krusei, NAA: NEGATIVE
Candida tropicalis, NAA: NEGATIVE
Trich vag by NAA: POSITIVE — AB

## 2015-06-25 MED ORDER — METRONIDAZOLE 500 MG PO TABS: 2000.0000 mg | ORAL_TABLET | Freq: Once | ORAL | Status: DC

## 2015-07-08 ENCOUNTER — Other Ambulatory Visit: Payer: Self-pay | Admitting: Certified Nurse Midwife

## 2015-07-08 DIAGNOSIS — N6310 Unspecified lump in the right breast, unspecified quadrant: Secondary | ICD-10-CM

## 2015-08-01 ENCOUNTER — Ambulatory Visit
Admission: RE | Admit: 2015-08-01 | Discharge: 2015-08-01 | Disposition: A | Discharge status: Home / Self Care | Payer: Commercial Managed Care - PPO | Source: Ambulatory Visit | Attending: Certified Nurse Midwife | Admitting: Certified Nurse Midwife

## 2015-08-01 DIAGNOSIS — N6310 Unspecified lump in the right breast, unspecified quadrant: Secondary | ICD-10-CM

## 2015-08-19 ENCOUNTER — Encounter: Payer: Self-pay | Admitting: Gastroenterology

## 2015-08-19 ENCOUNTER — Ambulatory Visit (INDEPENDENT_AMBULATORY_CARE_PROVIDER_SITE_OTHER): Payer: Commercial Managed Care - PPO | Admitting: Gastroenterology

## 2015-08-19 ENCOUNTER — Encounter (INDEPENDENT_AMBULATORY_CARE_PROVIDER_SITE_OTHER): Payer: Self-pay

## 2015-08-19 VITALS — BP 130/80 | HR 68 | Ht 64.0 in | Wt 154.1 lb

## 2015-08-19 DIAGNOSIS — Z800 Family history of malignant neoplasm of digestive organs: Secondary | ICD-10-CM | POA: Diagnosis not present

## 2015-08-19 DIAGNOSIS — Z86010 Personal history of colonic polyps: Secondary | ICD-10-CM | POA: Diagnosis not present

## 2015-08-19 MED ORDER — NA SULFATE-K SULFATE-MG SULF 17.5-3.13-1.6 GM/177ML PO SOLN: 1.0000 | Freq: Once | ORAL | 0 refills | Status: AC

## 2015-08-19 NOTE — Progress Notes (Signed)
HPI: This is a very pleasant 45 year old woman   who was referred to me by Morene Crocker, CNM  to evaluate  family history colon cancer, family history of colon polyps .    Chief complaint is family history colon cancer, family history of colon polyps. Mild chronic constipation  She has constipation: Miralax helps. She can go 5 days without a BM without the miralax.    Overall her weight has been stable.  Maternal aunt had colon cancer (age 59), died around age 54.  Many family members with colon polyps.  Never blood in her stools.  Labs 06/2015: cbc, cmet normal  Her weight has been stable  she has no significant abdominal pains    Review of systems: Pertinent positive and negative review of systems were noted in the above HPI section. Complete review of systems was performed and was otherwise normal.   Past Medical History:  Diagnosis Date  . Chronic headaches   . GERD (gastroesophageal reflux disease)     Past Surgical History:  Procedure Laterality Date  . ABDOMINAL HYSTERECTOMY    . kidney stones     x 3, also had a stent    Current Outpatient Prescriptions  Medication Sig Dispense Refill  . aspirin-acetaminophen-caffeine (EXCEDRIN MIGRAINE) 250-250-65 MG per tablet Take 2 tablets by mouth every 6 (six) hours as needed. Headache    . omeprazole (PRILOSEC) 20 MG capsule Take 20 mg by mouth daily.    . polyethylene glycol powder (GLYCOLAX/MIRALAX) powder Take 17 g by mouth daily. 500 g 0   No current facility-administered medications for this visit.     Allergies as of 08/19/2015 - Review Complete 08/19/2015  Allergen Reaction Noted  . Codeine  09/16/2011  . Hydrocodone Swelling 09/16/2011  . Percocet [oxycodone-acetaminophen] Swelling 09/16/2011    Family History  Problem Relation Age of Onset  . Hypertension Mother   . Hypertension Father     Social History   Social History  . Marital status: Single    Spouse name: N/A  . Number of children:  N/A  . Years of education: N/A   Occupational History  . Not on file.   Social History Main Topics  . Smoking status: Never Smoker  . Smokeless tobacco: Never Used  . Alcohol use No  . Drug use: No  . Sexual activity: No   Other Topics Concern  . Not on file   Social History Narrative  . No narrative on file     Physical Exam: Ht 5\' 4"  (1.626 m) Comment: height measured without shoes  Wt 154 lb 2 oz (69.9 kg)   BMI 26.46 kg/m  Constitutional: generally well-appearing Psychiatric: alert and oriented x3 Eyes: extraocular movements intact Mouth: oral pharynx moist, no lesions Neck: supple no lymphadenopathy Cardiovascular: heart regular rate and rhythm Lungs: clear to auscultation bilaterally Abdomen: soft, nontender, nondistended, no obvious ascites, no peritoneal signs, normal bowel sounds Extremities: no lower extremity edema bilaterally Skin: no lesions on visible extremities   Assessment and plan: 45 y.o. female with  family history colon cancer, family history of colon polyps  I recommended we proceed with colonoscopy at her soonest convenience. She has a single second-degree family member with colon cancer at a very young age and also several other members of her family, first degree relatives, with polyps. She tells me they were instructed to have colonoscopies about 5 years after that so I assume that these were precancerous polyps. She has mild chronic constipation and stool  softeners on a regular daily basis help. She can continue that.   Owens Loffler, MD [facility] 08/19/2015, 3:10 PM  Cc: Morene Crocker, CNM

## 2015-08-19 NOTE — Patient Instructions (Addendum)
You will be set up for a colonoscopy for FH of colon cancer and colon polyps.

## 2015-08-21 ENCOUNTER — Telehealth: Payer: Self-pay | Admitting: Gastroenterology

## 2015-08-21 NOTE — Telephone Encounter (Signed)
Magda Paganini colon is 10/07/15 can she be put on the list for sample.  Thanks   I have notified the pt that she will be called closer to her appt regarding when she can pick up.

## 2015-08-25 NOTE — Telephone Encounter (Signed)
Called [facility]. Pt cost is $76.16 for the Suprep. Discount coupon sent to [employer]. Pay less than 50 dollars. Left a detailed message explaining to pt the above info.

## 2015-08-28 ENCOUNTER — Telehealth: Payer: Self-pay | Admitting: Gastroenterology

## 2015-08-29 NOTE — Telephone Encounter (Signed)
Patient has been placed on the [facility] wait list. We will advise her when a sample is available for her to pick up.

## 2015-08-29 NOTE — Telephone Encounter (Signed)
Melinda Freeman her colon is 10/07/15 can you help with a sample? I have already called her and she is aware to call us within 2 weeks of the procedure to check on a sample.  wrenn

## 2015-09-15 ENCOUNTER — Encounter: Payer: Self-pay | Admitting: *Deleted

## 2015-09-23 ENCOUNTER — Encounter: Payer: Self-pay | Admitting: Gastroenterology

## 2015-09-30 ENCOUNTER — Telehealth: Payer: Self-pay | Admitting: Gastroenterology

## 2015-09-30 NOTE — Telephone Encounter (Signed)
Magda Paganini, do you have her on the list?

## 2015-09-30 NOTE — Telephone Encounter (Signed)
Left message that Suprep sample was up front to be picked up.

## 2015-10-07 ENCOUNTER — Ambulatory Visit (AMBULATORY_SURGERY_CENTER): Payer: Commercial Managed Care - PPO | Admitting: Gastroenterology

## 2015-10-07 ENCOUNTER — Encounter: Payer: Self-pay | Admitting: Gastroenterology

## 2015-10-07 VITALS — BP 100/57 | HR 75 | Temp 98.7°F | Resp 16 | Ht 64.0 in | Wt 154.0 lb

## 2015-10-07 DIAGNOSIS — Z800 Family history of malignant neoplasm of digestive organs: Secondary | ICD-10-CM | POA: Diagnosis not present

## 2015-10-07 DIAGNOSIS — D123 Benign neoplasm of transverse colon: Secondary | ICD-10-CM

## 2015-10-07 DIAGNOSIS — Z86010 Personal history of colonic polyps: Secondary | ICD-10-CM | POA: Diagnosis not present

## 2015-10-07 DIAGNOSIS — D125 Benign neoplasm of sigmoid colon: Secondary | ICD-10-CM

## 2015-10-07 MED ORDER — SODIUM CHLORIDE 0.9 % IV SOLN: 500.0000 mL | INTRAVENOUS | Status: DC

## 2015-10-07 NOTE — Progress Notes (Signed)
Report to PACU, RN, vss, BBS= Clear.  

## 2015-10-07 NOTE — Patient Instructions (Signed)
YOU HAD AN ENDOSCOPIC PROCEDURE TODAY AT THE [facility]:   Refer to the procedure report that was given to you for any specific questions about what was found during the examination.  If the procedure report does not answer your questions, please call your gastroenterologist to clarify.  If you requested that your care partner not be given the details of your procedure findings, then the procedure report has been included in a sealed envelope for you to review at your convenience later.  YOU SHOULD EXPECT: Some feelings of bloating in the abdomen. Passage of more gas than usual.  Walking can help get rid of the air that was put into your GI tract during the procedure and reduce the bloating. If you had a lower endoscopy (such as a colonoscopy or flexible sigmoidoscopy) you may notice spotting of blood in your stool or on the toilet paper. If you underwent a bowel prep for your procedure, you may not have a normal bowel movement for a few days.  Please Note:  You might notice some irritation and congestion in your nose or some drainage.  This is from the oxygen used during your procedure.  There is no need for concern and it should clear up in a day or so.  SYMPTOMS TO REPORT IMMEDIATELY:   Following lower endoscopy (colonoscopy or flexible sigmoidoscopy):  Excessive amounts of blood in the stool  Significant tenderness or worsening of abdominal pains  Swelling of the abdomen that is new, acute  Fever of 100F or higher  For urgent or emergent issues, a gastroenterologist can be reached at any hour by calling [phone].   DIET:  We do recommend a small meal at first, but then you may proceed to your regular diet.  Drink plenty of fluids but you should avoid alcoholic beverages for 24 hours.  ACTIVITY:  You should plan to take it easy for the rest of today and you should NOT DRIVE or use heavy machinery until tomorrow (because of the sedation medicines used during the test).     FOLLOW UP: Our staff will call the number listed on your records the next business day following your procedure to check on you and address any questions or concerns that you may have regarding the information given to you following your procedure. If we do not reach you, we will leave a message.  However, if you are feeling well and you are not experiencing any problems, there is no need to return our call.  We will assume that you have returned to your regular daily activities without incident.  If any biopsies were taken you will be contacted by phone or by letter within the next 1-3 weeks.  Please call us at [phone] if you have not heard about the biopsies in 3 weeks.    SIGNATURES/CONFIDENTIALITY: You and/or your care partner have signed paperwork which will be entered into your electronic medical record.  These signatures attest to the fact that that the information above on your After Visit Summary has been reviewed and is understood.  Full responsibility of the confidentiality of this discharge information lies with you and/or your care-partner. 

## 2015-10-07 NOTE — Op Note (Signed)
[facility] Patient Name: Melinda Freeman Procedure Date: 10/07/2015 1:21 PM MRN: [id number] Endoscopist: Milus Banister , MD Age: 45 Referring MD:  Date of Birth: [birth year] Gender: Female Account #: [id number] Procedure:                Colonoscopy Indications:              Colon cancer screening in patient at increased                            risk: Family history of colorectal cancer 1 2nd                            degree relative and several 1st degree relatives                            with polyps Medicines:                Monitored Anesthesia Care Procedure:                Pre-Anesthesia Assessment:                           - Prior to the procedure, a History and Physical                            was performed, and patient medications and                            allergies were reviewed. The patient's tolerance of                            previous anesthesia was also reviewed. The risks                            and benefits of the procedure and the sedation                            options and risks were discussed with the patient.                            All questions were answered, and informed consent                            was obtained. Prior Anticoagulants: The patient has                            taken no previous anticoagulant or antiplatelet                            agents. ASA Grade Assessment: II - A patient with                            mild systemic disease. After reviewing the risks  and benefits, the patient was deemed in                            satisfactory condition to undergo the procedure.                           After obtaining informed consent, the colonoscope                            was passed under direct vision. Throughout the                            procedure, the patient's blood pressure, pulse, and                            oxygen saturations were monitored continuously. The                           Model CF-HQ190L [id number]) scope was introduced                            through the anus and advanced to the the cecum,                            identified by appendiceal orifice and ileocecal                            valve. The colonoscopy was performed without                            difficulty. The patient tolerated the procedure                            well. The quality of the bowel preparation was                            excellent. The ileocecal valve, appendiceal                            orifice, and rectum were photographed. Scope In: 1:25:13 PM Scope Out: 1:37:39 PM Scope Withdrawal Time: 0 hours 9 minutes 35 seconds  Total Procedure Duration: 0 hours 12 minutes 26 seconds  Findings:                 Two sessile polyps were found in the sigmoid colon                            and transverse colon. The polyps were 3 to 5 mm in                            size. These polyps were removed with a cold snare.                            Resection and retrieval were complete.  The exam was otherwise without abnormality on                            direct and retroflexion views. Complications:            No immediate complications. Estimated blood loss:                            None. Estimated Blood Loss:     Estimated blood loss: none. Impression:               - Two 3 to 5 mm polyps in the sigmoid colon and in                            the transverse colon, removed with a cold snare.                            Resected and retrieved.                           - The examination was otherwise normal on direct                            and retroflexion views. Recommendation:           - Patient has a contact number available for                            emergencies. The signs and symptoms of potential                            delayed complications were discussed with the                            patient. Return  to normal activities tomorrow.                            Written discharge instructions were provided to the                            patient.                           - Resume previous diet.                           - Continue present medications.                           You will receive a letter within 2-3 weeks with the                            pathology results and my final recommendations.                           If the polyp(s) is proven to be 'pre-cancerous' on  pathology, you will need repeat colonoscopy in 5                            years. If the polyp(s) is NOT 'precancerous' on                            pathology then you should repeat colon cancer                            screening in 10 years with colonoscopy without need                            for colon cancer screening by any method prior to                            then (including stool testing). Milus Banister, MD 10/07/2015 1:51:14 PM This report has been signed electronically.

## 2015-10-08 ENCOUNTER — Telehealth: Payer: Self-pay

## 2015-10-08 NOTE — Telephone Encounter (Signed)
  Follow up Call-  Call back number 10/07/2015  Post procedure Call Back phone  # [phone]  Permission to leave phone message Yes  Some recent data might be hidden    Patient was called for follow up after her procedure on 10/07/2015. No answer at the number given for follow up phone call. A message was left on the answering machine.

## 2015-10-16 ENCOUNTER — Encounter: Payer: Self-pay | Admitting: Gastroenterology

## 2017-01-31 IMAGING — MG 2D DIGITAL DIAGNOSTIC BILATERAL MAMMOGRAM WITH CAD AND ADJUNCT T
8 of 12 series · 8 of 28 positions shown · non-contrast
Comparison: Mammography 06/27/2013 (right), 06/07/2013 (bilateral).

CLINICAL DATA: Two year interval followup of likely benign right
breast mass. Annual evaluation, left breast.

EXAM:
2D DIGITAL DIAGNOSTIC BILATERAL MAMMOGRAM WITH CAD AND ADJUNCT TOMO
ULTRASOUND RIGHT BREAST

[R MLO synth-2D]
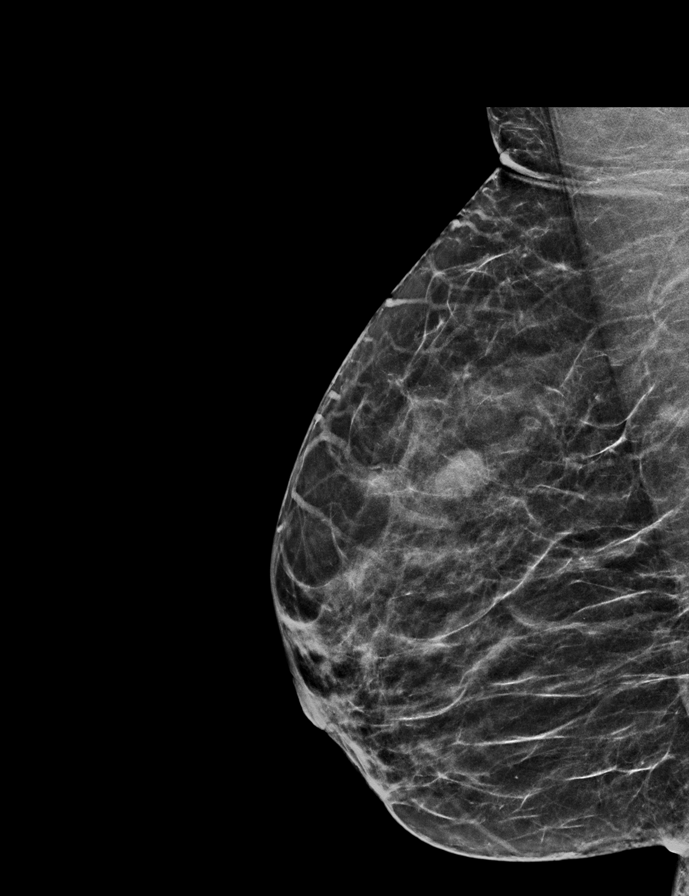

[R MLO]
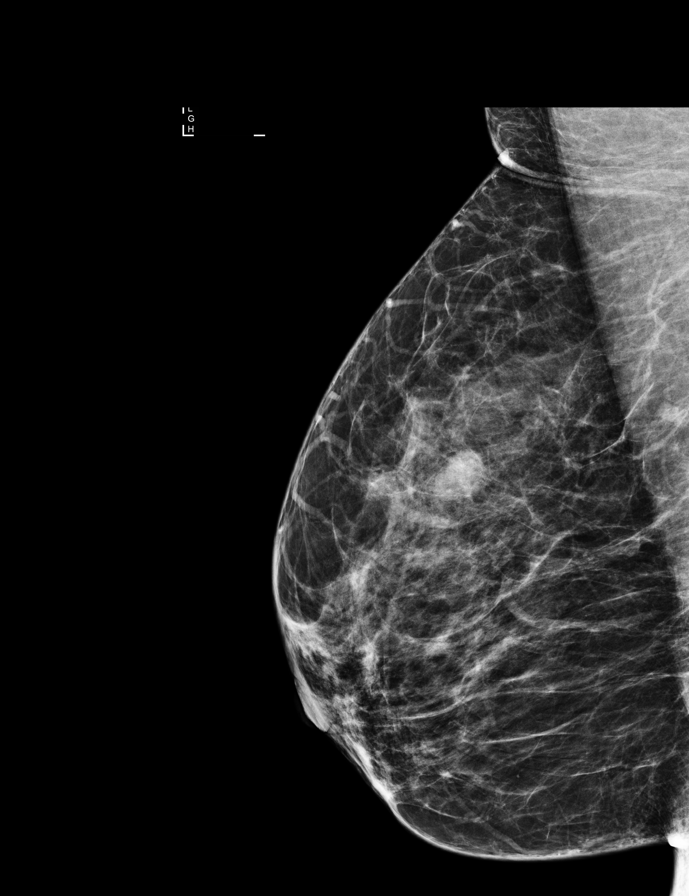

[R CC]
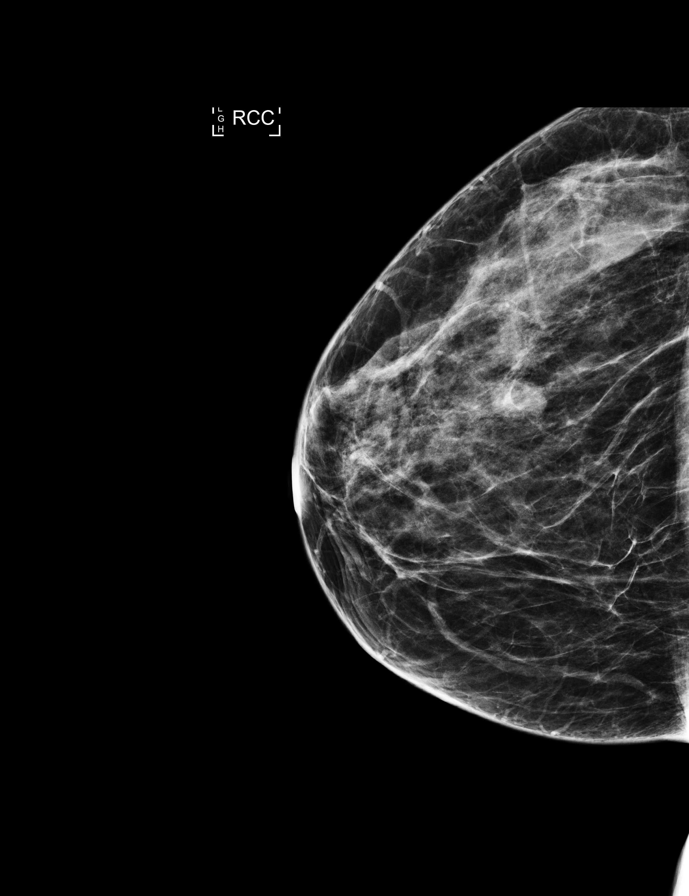

[L CC]
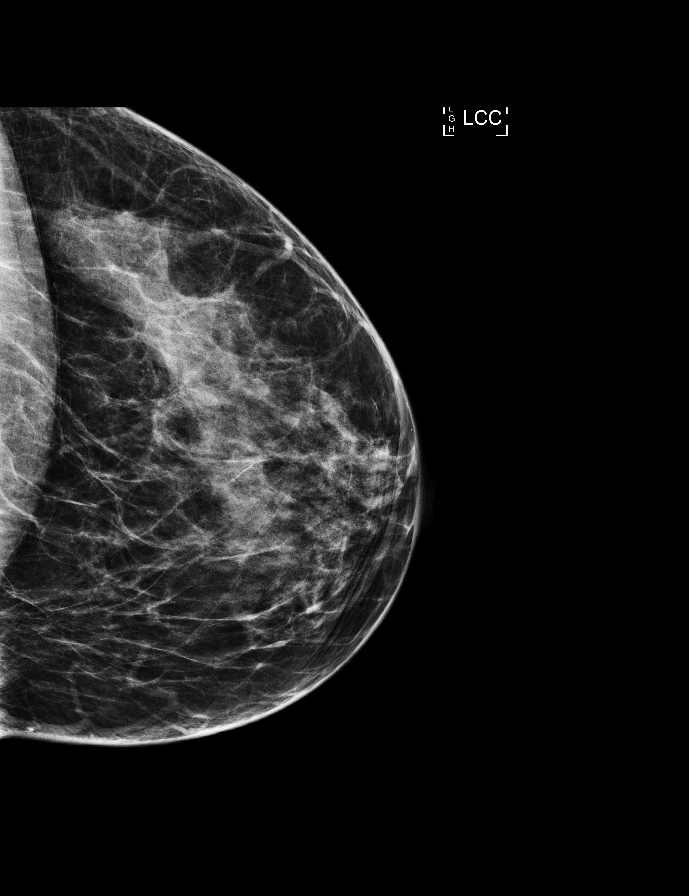

[L MLO synth-2D]
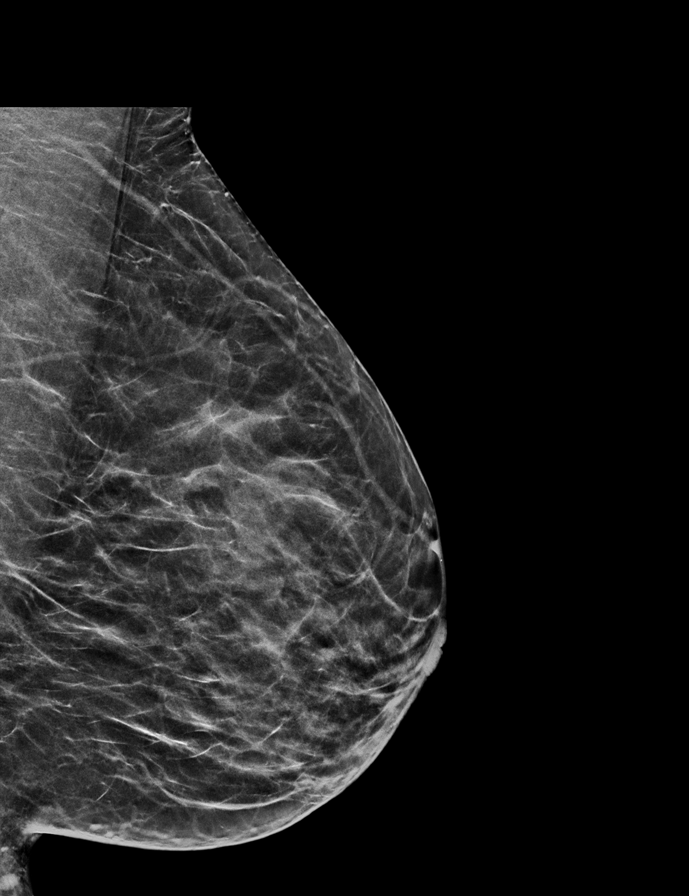

[L MLO]
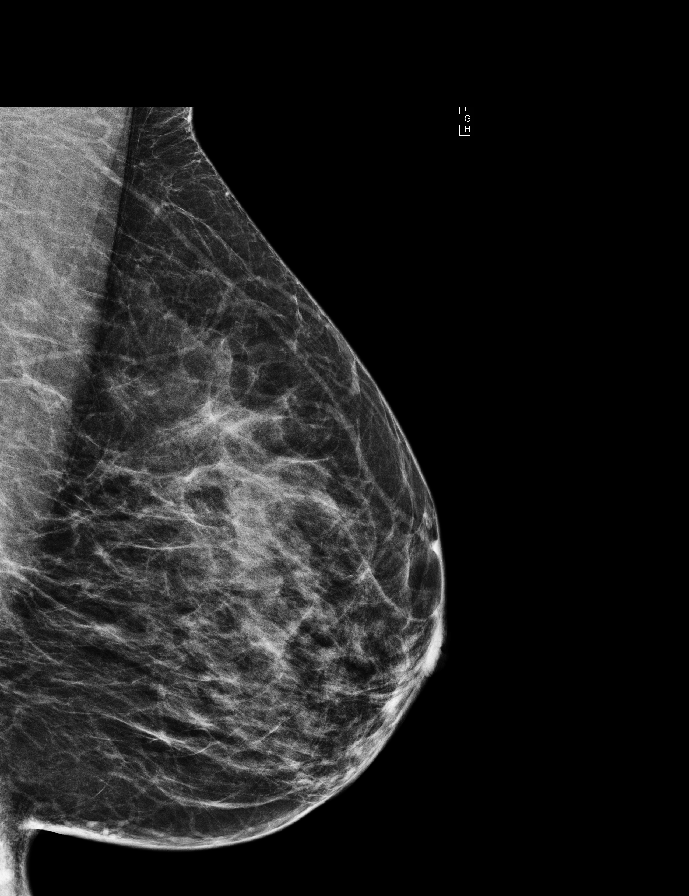

[L CC synth-2D]
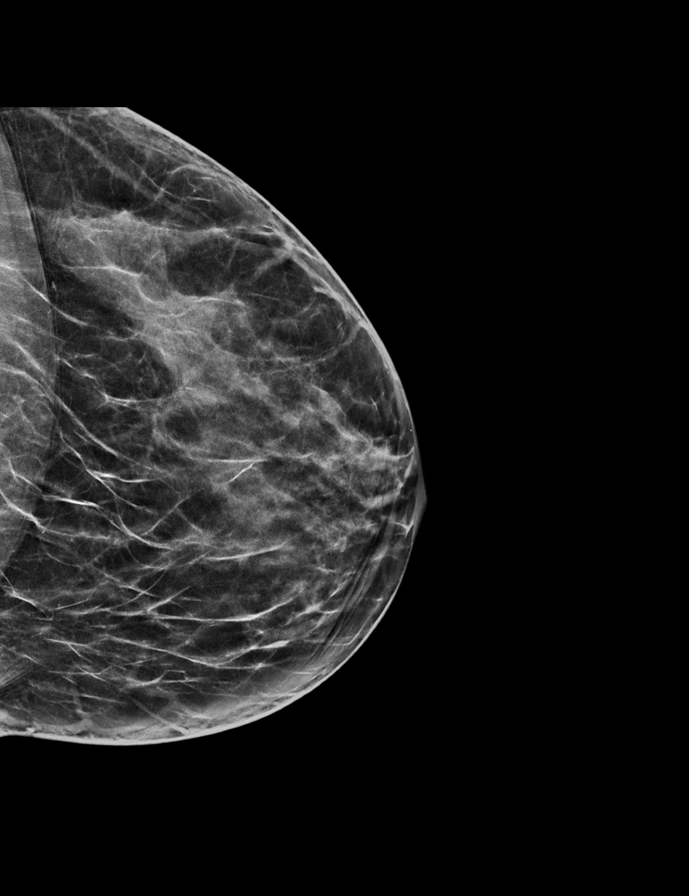

[R CC synth-2D]
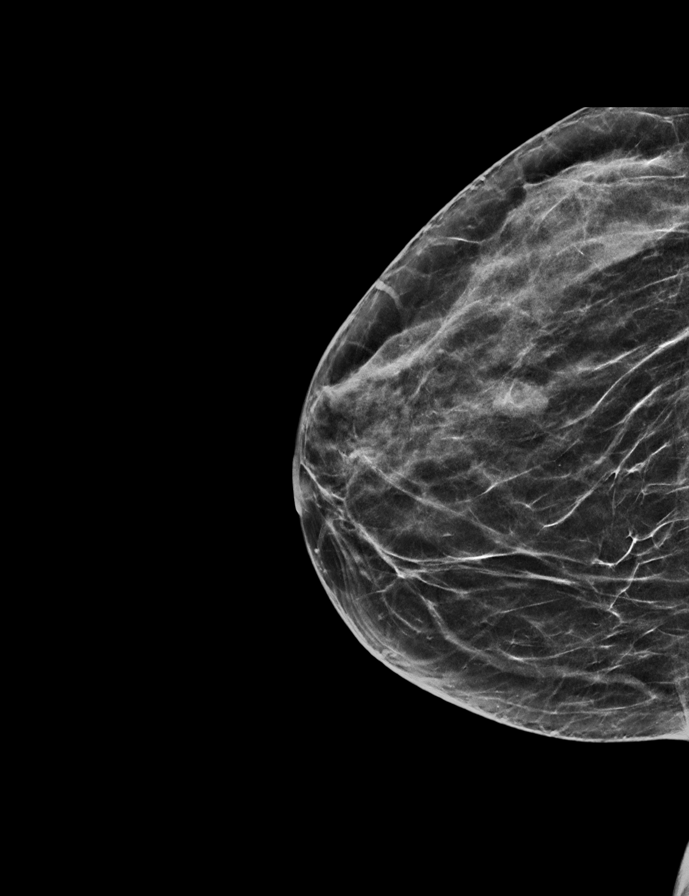

[8 of 28 positions shown; findings below may reference images not displayed]

Right breast ultrasound 06/27/2013.

ACR Breast Density Category c: The breast tissue is heterogeneously
dense, which may obscure small masses.
FINDINGS: Standard 2D and tomosynthesis full field CC and MLO views of both
breasts were obtained. The circumscribed mass in the upper outer
right breast is unchanged in appearance, measuring approximately
x 1.0 x 1.1 cm. There is no associated architectural distortion or
suspicious calcification. No new or suspicious findings elsewhere in
the right breast.

No findings suspicious for malignancy in the left breast.

Mammographic images were processed with CAD.

On physical exam, there is a mobile palpable approximate 1-2 cm mass
in the upper outer right breast.

Targeted right breast ultrasound is performed, showing the
circumscribed oval parallel hypoechoic mass with equal acoustic
characteristics and internal power Doppler flow at the 11 o'clock
position approximately 1 cm from the nipple measuring approximately
0.9 x 0.7 x 1.3 cm, unchanged from the prior ultrasound. No new
suspicious solid mass or abnormal acoustic shadowing is identified.
IMPRESSION: 1. Stable benign right breast mass dating back to June 2013, likely
a fibroadenoma.
2. No mammographic or sonographic evidence of malignancy, right
breast.
3. No mammographic evidence of malignancy, left breast.

RECOMMENDATION:
As the right breast mass has been stable for 2 years, the patient
may return to annual screening. Screening mammogram in one year is
recommended.(Code:AM-P-FYA)

I have discussed the findings and recommendations with the patient.
Results were also provided in writing at the conclusion of the
visit. If applicable, a reminder letter will be sent to the patient
regarding the next appointment.

BI-RADS CATEGORY  2: Benign.

## 2017-02-14 ENCOUNTER — Encounter: Payer: Self-pay | Admitting: Obstetrics

## 2017-02-14 ENCOUNTER — Ambulatory Visit (INDEPENDENT_AMBULATORY_CARE_PROVIDER_SITE_OTHER): Payer: Commercial Managed Care - PPO | Admitting: Obstetrics

## 2017-02-14 ENCOUNTER — Other Ambulatory Visit: Payer: Self-pay | Admitting: Obstetrics

## 2017-02-14 VITALS — BP 119/84 | HR 69 | Ht 64.0 in | Wt 152.0 lb

## 2017-02-14 DIAGNOSIS — Z01419 Encounter for gynecological examination (general) (routine) without abnormal findings: Secondary | ICD-10-CM | POA: Diagnosis not present

## 2017-02-14 DIAGNOSIS — Z113 Encounter for screening for infections with a predominantly sexual mode of transmission: Secondary | ICD-10-CM

## 2017-02-14 DIAGNOSIS — Z0000 Encounter for general adult medical examination without abnormal findings: Secondary | ICD-10-CM

## 2017-02-14 DIAGNOSIS — Z139 Encounter for screening, unspecified: Secondary | ICD-10-CM

## 2017-02-14 NOTE — Progress Notes (Signed)
Subjective:        Melinda Freeman is a 47 y.o. female here for a routine exam.  Current complaints: None.    Personal health questionnaire:  Is patient Ashkenazi Jewish, have a family history of breast and/or ovarian cancer: no Is there a family history of uterine cancer diagnosed at age < 32, gastrointestinal cancer, urinary tract cancer, family member who is a Field seismologist syndrome-associated carrier: no Is the patient overweight and hypertensive, family history of diabetes, personal history of gestational diabetes, preeclampsia or PCOS: no Is patient over 71, have PCOS,  family history of premature CHD under age 24, diabetes, smoke, have hypertension or peripheral artery disease:  no At any time, has a partner hit, kicked or otherwise hurt or frightened you?: no Over the past 2 weeks, have you felt down, depressed or hopeless?: no Over the past 2 weeks, have you felt little interest or pleasure in doing things?:no   Gynecologic History No LMP recorded. Patient has had a hysterectomy. Contraception: status post hysterectomy Last Pap: 2017 ( vaginal ). Results were: normal Last mammogram: 2017. Results were: normal  Obstetric History OB History  Gravida Para Term Preterm AB Living  4 4 3 1   4   SAB TAB Ectopic Multiple Live Births          4    # Outcome Date GA Lbr Len/2nd Weight Sex Delivery Anes PTL Lv  4 Preterm 10/03/96 [redacted]w[redacted]d   F Vag-Spont None  LIV  3 Term 10/31/91 [redacted]w[redacted]d   M Vag-Spont None  LIV  2 Term 12/02/90 [redacted]w[redacted]d   M Vag-Spont EPI  LIV  1 Term 09/27/89 [redacted]w[redacted]d   M Vag-Spont EPI  LIV      Past Medical History:  Diagnosis Date  . Chronic headaches   . GERD (gastroesophageal reflux disease)     Past Surgical History:  Procedure Laterality Date  . ABDOMINAL HYSTERECTOMY    . kidney stones     x 3, also had a stent     Current Outpatient Medications:  .  aspirin-acetaminophen-caffeine (EXCEDRIN MIGRAINE) 250-250-65 MG per tablet, Take 2 tablets by mouth every 6  (six) hours as needed. Headache, Disp: , Rfl:  .  cimetidine (TAGAMET) 200 MG tablet, Take 200 mg by mouth 2 (two) times daily., Disp: , Rfl:  .  polyethylene glycol powder (GLYCOLAX/MIRALAX) powder, Take 17 g by mouth daily., Disp: 500 g, Rfl: 0 .  omeprazole (PRILOSEC) 20 MG capsule, Take 20 mg by mouth daily., Disp: , Rfl:   Current Facility-Administered Medications:  .  0.9 %  sodium chloride infusion, 500 mL, Intravenous, Continuous, Milus Banister, MD Allergies  Allergen Reactions  . Codeine     Fainting   . Hydrocodone Swelling  . Percocet [Oxycodone-Acetaminophen] Swelling    Social History   Tobacco Use  . Smoking status: Never Smoker  . Smokeless tobacco: Never Used  Substance Use Topics  . Alcohol use: Yes    Comment: wine once or twice a month    Family History  Problem Relation Age of Onset  . Hypertension Mother   . Hypertension Father   . Colon cancer Maternal Aunt   . Diabetes Maternal Aunt   . Colon polyps Maternal Aunt        x 2  . Colon polyps Cousin        x 2      Review of Systems  Constitutional: negative for fatigue and weight loss Respiratory: negative for cough and  wheezing Cardiovascular: negative for chest pain, fatigue and palpitations Gastrointestinal: negative for abdominal pain and change in bowel habits Musculoskeletal:negative for myalgias Neurological: negative for gait problems and tremors Behavioral/Psych: negative for abusive relationship, depression Endocrine: negative for temperature intolerance    Genitourinary:negative for abnormal menstrual periods, genital lesions, hot flashes, sexual problems and vaginal discharge Integument/breast: negative for breast lump, breast tenderness, nipple discharge and skin lesion(s)    Objective:       BP 119/84   Pulse 69   Ht 5\' 4"  (1.626 m)   Wt 152 lb (68.9 kg)   BMI 26.09 kg/m  General:   alert  Skin:   no rash or abnormalities  Lungs:   clear to auscultation bilaterally   Heart:   regular rate and rhythm, S1, S2 normal, no murmur, click, rub or gallop  Breasts:   normal without suspicious masses, skin or nipple changes or axillary nodes  Abdomen:  normal findings: no organomegaly, soft, non-tender and no hernia  Pelvis:  External genitalia: normal general appearance Urinary system: urethral meatus normal and bladder without fullness, nontender Vaginal: normal without tenderness, induration or masses Cervix: absent Adnexa: normal bimanual exam Uterus: absent   Lab Review Urine pregnancy test Labs reviewed yes Radiologic studies reviewed yes  50% of 20 min visit spent on counseling and coordination of care.   Assessment:     1. Encounter for annual routine gynecological examination Rx: - Cervicovaginal ancillary only - TSH - Lipid panel - Comprehensive metabolic panel - CBC w/Diff  2. Screening for STD (sexually transmitted disease) Rx: - Cervicovaginal ancillary only - Hepatitis B surface antigen - Hepatitis C antibody - HIV antibody - RPR  3. Routine adult health maintenance Rx: - Ambulatory referral to Internal Medicine   Plan:    Education reviewed: calcium supplements, depression evaluation, low fat, low cholesterol diet, safe sex/STD prevention, self breast exams and weight bearing exercise. Follow up in: 1 year.   No orders of the defined types were placed in this encounter.  Orders Placed This Encounter  Procedures  . Hepatitis B surface antigen  . Hepatitis C antibody  . HIV antibody  . RPR  . TSH  . Lipid panel  . Comprehensive metabolic panel  . CBC w/Diff  . Ambulatory referral to Internal Medicine    Referral Priority:   Routine    Referral Type:   Consultation    Referral Reason:   Specialty Services Required    Requested Specialty:   Internal Medicine    Number of Visits Requested:   1

## 2017-02-14 NOTE — Progress Notes (Signed)
LMP:HYST Mammogram:08/01/2015  Last Pap: 06/20/2015 Colonoscopy: 2018 WNL polys return in 5 yrs.   Contraception: HYST  PCP: None   STD Screening: desired Full panel  Pt states she has noticed she has been wheezing

## 2017-02-15 LAB — CERVICOVAGINAL ANCILLARY ONLY
BACTERIAL VAGINITIS: NEGATIVE
Candida vaginitis: NEGATIVE
TRICH (WINDOWPATH): NEGATIVE

## 2017-02-15 LAB — COMPREHENSIVE METABOLIC PANEL WITH GFR
ALT: 19 [IU]/L (ref 0–32)
AST: 21 [IU]/L (ref 0–40)
Albumin/Globulin Ratio: 1.4 (ref 1.2–2.2)
Albumin: 4.3 g/dL (ref 3.5–5.5)
Alkaline Phosphatase: 33 [IU]/L — ABNORMAL LOW (ref 39–117)
BUN/Creatinine Ratio: 9 (ref 9–23)
BUN: 8 mg/dL (ref 6–24)
Bilirubin Total: 0.2 mg/dL (ref 0.0–1.2)
CO2: 24 mmol/L (ref 20–29)
Calcium: 9.1 mg/dL (ref 8.7–10.2)
Chloride: 100 mmol/L (ref 96–106)
Creatinine, Ser: 0.93 mg/dL (ref 0.57–1.00)
GFR calc Af Amer: 86 mL/min/{1.73_m2}
GFR calc non Af Amer: 75 mL/min/{1.73_m2}
Globulin, Total: 3 g/dL (ref 1.5–4.5)
Glucose: 80 mg/dL (ref 65–99)
Potassium: 4.3 mmol/L (ref 3.5–5.2)
Sodium: 138 mmol/L (ref 134–144)
Total Protein: 7.3 g/dL (ref 6.0–8.5)

## 2017-02-15 LAB — CBC WITH DIFFERENTIAL/PLATELET
Basophils Absolute: 0.1 10*3/uL (ref 0.0–0.2)
Basos: 1 %
EOS (ABSOLUTE): 0.1 10*3/uL (ref 0.0–0.4)
Eos: 2 %
Hematocrit: 37.7 % (ref 34.0–46.6)
Hemoglobin: 12.9 g/dL (ref 11.1–15.9)
Immature Grans (Abs): 0 10*3/uL (ref 0.0–0.1)
Immature Granulocytes: 0 %
Lymphocytes Absolute: 2.1 10*3/uL (ref 0.7–3.1)
Lymphs: 44 %
MCH: 31.8 pg (ref 26.6–33.0)
MCHC: 34.2 g/dL (ref 31.5–35.7)
MCV: 93 fL (ref 79–97)
Monocytes Absolute: 0.4 10*3/uL (ref 0.1–0.9)
Monocytes: 8 %
Neutrophils Absolute: 2.2 10*3/uL (ref 1.4–7.0)
Neutrophils: 45 %
Platelets: 255 10*3/uL (ref 150–379)
RBC: 4.06 x10E6/uL (ref 3.77–5.28)
RDW: 12.9 % (ref 12.3–15.4)
WBC: 4.9 10*3/uL (ref 3.4–10.8)

## 2017-02-15 LAB — LIPID PANEL
Chol/HDL Ratio: 2.4 ratio (ref 0.0–4.4)
Cholesterol, Total: 198 mg/dL (ref 100–199)
HDL: 82 mg/dL
LDL Calculated: 96 mg/dL (ref 0–99)
Triglycerides: 101 mg/dL (ref 0–149)
VLDL Cholesterol Cal: 20 mg/dL (ref 5–40)

## 2017-02-15 LAB — TSH: TSH: 2 u[IU]/mL (ref 0.450–4.500)

## 2017-02-15 LAB — HEPATITIS B SURFACE ANTIGEN: HEP B S AG: NEGATIVE

## 2017-02-15 LAB — HIV ANTIBODY (ROUTINE TESTING W REFLEX): HIV Screen 4th Generation wRfx: NONREACTIVE

## 2017-02-15 LAB — RPR: RPR Ser Ql: NONREACTIVE

## 2017-02-15 LAB — HEPATITIS C ANTIBODY

## 2017-02-17 ENCOUNTER — Encounter (HOSPITAL_COMMUNITY): Payer: Self-pay | Admitting: Emergency Medicine

## 2017-02-17 ENCOUNTER — Ambulatory Visit (HOSPITAL_COMMUNITY)
Admission: EM | Admit: 2017-02-17 | Discharge: 2017-02-17 | Disposition: A | Discharge status: Home / Self Care | Payer: Commercial Managed Care - PPO | Attending: Family Medicine | Admitting: Family Medicine

## 2017-02-17 ENCOUNTER — Other Ambulatory Visit: Payer: Self-pay

## 2017-02-17 DIAGNOSIS — J40 Bronchitis, not specified as acute or chronic: Secondary | ICD-10-CM

## 2017-02-17 DIAGNOSIS — H60391 Other infective otitis externa, right ear: Secondary | ICD-10-CM | POA: Diagnosis not present

## 2017-02-17 DIAGNOSIS — R05 Cough: Secondary | ICD-10-CM

## 2017-02-17 DIAGNOSIS — H9201 Otalgia, right ear: Secondary | ICD-10-CM | POA: Diagnosis not present

## 2017-02-17 DIAGNOSIS — R059 Cough, unspecified: Secondary | ICD-10-CM

## 2017-02-17 MED ORDER — BENZONATATE 100 MG PO CAPS: 200.0000 mg | ORAL_CAPSULE | Freq: Three times a day (TID) | ORAL | 0 refills | Status: DC | PRN

## 2017-02-17 MED ORDER — AMOXICILLIN 875 MG PO TABS: 875.0000 mg | ORAL_TABLET | Freq: Two times a day (BID) | ORAL | 0 refills | Status: DC

## 2017-02-17 MED ORDER — NEOMYCIN-POLYMYXIN-HC 3.5-10000-1 OT SUSP: 4.0000 [drp] | Freq: Four times a day (QID) | OTIC | 0 refills | Status: DC

## 2017-02-17 MED ORDER — ALBUTEROL SULFATE HFA 108 (90 BASE) MCG/ACT IN AERS: 1.0000 | INHALATION_SPRAY | Freq: Four times a day (QID) | RESPIRATORY_TRACT | 0 refills | Status: AC | PRN

## 2017-02-17 NOTE — ED Provider Notes (Signed)
[facility]   [id number] 02/17/17 Arrival Time: 0865  ASSESSMENT & PLAN:  1. Bronchitis   2. Cough   3. Right ear pain   4. Infective otitis externa of right ear     Meds ordered this encounter  Medications  . albuterol (PROVENTIL HFA;VENTOLIN HFA) 108 (90 Base) MCG/ACT inhaler    Sig: Inhale 1-2 puffs into the lungs every 6 (six) hours as needed for wheezing or shortness of breath.    Dispense:  1 Inhaler    Refill:  0    Order Specific Question:   Supervising Provider    Answer:   Robyn Haber [[id number]]  . DISCONTD: benzonatate (TESSALON) 100 MG capsule    Sig: Take 2 capsules (200 mg total) by mouth 3 (three) times daily as needed for cough.    Dispense:  21 capsule    Refill:  0    Order Specific Question:   Supervising Provider    Answer:   Robyn Haber [[id number]]  . amoxicillin (AMOXIL) 875 MG tablet    Sig: Take 1 tablet (875 mg total) by mouth 2 (two) times daily.    Dispense:  20 tablet    Refill:  0    Order Specific Question:   Supervising Provider    Answer:   Robyn Haber [[id number]]  . neomycin-polymyxin-hydrocortisone (CORTISPORIN) 3.5-10000-1 OTIC suspension    Sig: Place 4 drops into the right ear 4 (four) times daily.    Dispense:  10 mL    Refill:  0    Order Specific Question:   Supervising Provider    Answer:   Robyn Haber [[id number]]  . benzonatate (TESSALON) 100 MG capsule    Sig: Take 2 capsules (200 mg total) by mouth 3 (three) times daily as needed for cough.    Dispense:  21 capsule    Refill:  0    Order Specific Question:   Supervising Provider    Answer:   Robyn Haber [[id number]]    Reviewed expectations re: course of current medical issues. Questions answered. Outlined signs and symptoms indicating need for more acute intervention. Patient verbalized understanding. After Visit Summary given.   SUBJECTIVE: History from: patient. Melinda Freeman is a 47 y.o. female who presents with complaint of persistent right ear pain x 2  days.  She has been trying to get wax out and now her ear is painful. Reports gradual onset yesterday. Described symptoms have gradually worsened since beginning.  ROS: As per HPI.   OBJECTIVE:  Vitals:   02/17/17 1649  BP: (!) 128/91  Pulse: 65  Resp: 18  Temp: 98.6 F (37 C)  SpO2: 100%    General appearance: alert; no distress Eyes: PERRLA; EOMI; conjunctiva normal HENT: normocephalic; atraumatic; right EAC with pain with speculum exam and decreased patency and pain with palpation right tragus.  Left EAC and TM wnl. Neck: supple  Lungs: clear to auscultation bilaterally Heart: regular rate and rhythm Abdomen: soft, non-tender; bowel sounds normal; no masses or organomegaly; no guarding or rebound tenderness Back: no CVA tenderness Extremities: no cyanosis or edema; symmetrical with no gross deformities Skin: warm and dry Neurologic: normal gait; normal symmetric reflexes Psychological: alert and cooperative; normal mood and affect  Labs:  Labs Reviewed - No data to display  Imaging: No results found.  Allergies  Allergen Reactions  . Codeine     Fainting   . Hydrocodone Swelling  . Percocet [Oxycodone-Acetaminophen] Swelling    Past Medical History:  Diagnosis Date  .  Chronic headaches   . GERD (gastroesophageal reflux disease)    Social History   Socioeconomic History  . Marital status: Single    Spouse name: Not on file  . Number of children: 4  . Years of education: Not on file  . Highest education level: Not on file  Social Needs  . Financial resource strain: Not on file  . Food insecurity - worry: Not on file  . Food insecurity - inability: Not on file  . Transportation needs - medical: Not on file  . Transportation needs - non-medical: Not on file  Occupational History  . Occupation: customer service rep  Tobacco Use  . Smoking status: Never Smoker  . Smokeless tobacco: Never Used  Substance and Sexual Activity  . Alcohol use: Yes     Comment: wine once or twice a month  . Drug use: No  . Sexual activity: Yes    Birth control/protection: Surgical  Other Topics Concern  . Not on file  Social History Narrative  . Not on file   Family History  Problem Relation Age of Onset  . Hypertension Mother   . Hypertension Father   . Colon cancer Maternal Aunt   . Diabetes Maternal Aunt   . Colon polyps Maternal Aunt        x 2  . Colon polyps Cousin        x 2   Past Surgical History:  Procedure Laterality Date  . ABDOMINAL HYSTERECTOMY    . kidney stones     x 3, also had a stent     Lysbeth Penner, FNP 02/17/17 1705

## 2017-02-17 NOTE — ED Triage Notes (Signed)
Pt c/o R ear pain, states "I think it might be a wax buildup". Pt also c/o cough

## 2017-03-07 ENCOUNTER — Ambulatory Visit: Payer: Commercial Managed Care - PPO

## 2017-05-02 ENCOUNTER — Ambulatory Visit: Payer: Commercial Managed Care - PPO | Admitting: Obstetrics

## 2018-05-04 ENCOUNTER — Ambulatory Visit: Payer: Commercial Managed Care - PPO | Admitting: Obstetrics

## 2018-07-24 ENCOUNTER — Ambulatory Visit (HOSPITAL_COMMUNITY)
Admission: EM | Admit: 2018-07-24 | Discharge: 2018-07-24 | Disposition: A | Discharge status: Home / Self Care | Payer: BC Managed Care – PPO

## 2018-07-24 ENCOUNTER — Encounter (HOSPITAL_COMMUNITY): Payer: Self-pay | Admitting: Emergency Medicine

## 2018-07-24 ENCOUNTER — Other Ambulatory Visit: Payer: Self-pay

## 2018-07-24 DIAGNOSIS — M545 Low back pain, unspecified: Secondary | ICD-10-CM

## 2018-07-24 LAB — POCT URINALYSIS DIP (DEVICE)
Bilirubin Urine: NEGATIVE
Glucose, UA: NEGATIVE mg/dL
Hgb urine dipstick: NEGATIVE
Ketones, ur: NEGATIVE mg/dL
Leukocytes,Ua: NEGATIVE
Nitrite: NEGATIVE
Protein, ur: NEGATIVE mg/dL
Specific Gravity, Urine: 1.01 (ref 1.005–1.030)
Urobilinogen, UA: 0.2 mg/dL (ref 0.0–1.0)
pH: 7 (ref 5.0–8.0)

## 2018-07-24 MED ORDER — PREDNISONE 10 MG (21) PO TBPK: ORAL_TABLET | Freq: Every day | ORAL | 0 refills | Status: DC

## 2018-07-24 MED ORDER — TIZANIDINE HCL 2 MG PO TABS: 2.0000 mg | ORAL_TABLET | Freq: Every day | ORAL | 0 refills | Status: DC

## 2018-07-24 NOTE — Discharge Instructions (Signed)
Your urine is completely normal here today, which is reassuring for kidney etiology at this time.  Exam is concerning for muscular source of pain.  We will try a steroid pack to help with inflammation.  Don't take aleve or ibuprofen until this pack has been finished.  Muscle relaxer at night. May cause drowsiness. Please do not take if driving or drinking alcohol.   Light and regular activity as tolerated.  See exercises provided.  Heat application while active can help with muscle spasms.  Sleep with pillow under your knees.   Follow up with primary care provider as needed for persistent symptoms.

## 2018-07-24 NOTE — ED Triage Notes (Signed)
Pt reports left flank pain for the last two weeks.  She states it has been getting progressively worse, with no relief from Tylenol or Ibuprofen.  Pt has a history of kidney stones.

## 2018-07-24 NOTE — ED Provider Notes (Signed)
Melinda Freeman    CSN: [id number] Arrival date & time: 07/24/18  1403     History   Chief Complaint Chief Complaint  Patient presents with  . Flank Pain    left    HPI Melinda Freeman is a 48 y.o. female.   Melinda Freeman presents with complaints of left low back and side pain which has been constant for the past approximately 2 weeks. Worse if she "slouches." No specific known injury. She works from home at [employer], has done this since last fall. Pain was worse this morning when she woke, she felt a stabbing type pain, this recurred today at around noon as well. She took ibuprofen and aleve which did not help. No urinary symptoms. No gi symptoms. Has had kidney stones before and has had to have surgery, but it has been quite some time since her last stone. No abdominal pain. No pain to posterior thigh or buttock. Pain 8/10. She feels like kidney stone pain has been worse than the pain she is experiencing.      Past Medical History:  Diagnosis Date  . Chronic headaches   . GERD (gastroesophageal reflux disease)     Patient Active Problem List   Diagnosis Date Noted  . Constipation, chronic 06/20/2015  . Elevated blood pressure 06/20/2015    Past Surgical History:  Procedure Laterality Date  . ABDOMINAL HYSTERECTOMY    . kidney stones     x 3, also had a stent    OB History    Gravida  4   Para  4   Term  3   Preterm  1   AB      Living  4     SAB      TAB      Ectopic      Multiple      Live Births  4            Home Medications    Prior to Admission medications   Medication Sig Start Date End Date Taking? Authorizing Provider  naproxen sodium (ALEVE) 220 MG tablet Take 440 mg by mouth.   Yes [provider]  traZODone (DESYREL) 50 MG tablet Take 25 mg by mouth at bedtime.   Yes [provider]  albuterol (PROVENTIL HFA;VENTOLIN HFA) 108 (90 Base) MCG/ACT inhaler Inhale 1-2 puffs into the lungs every 6 (six) hours as  needed for wheezing or shortness of breath. 02/17/17   Lysbeth Penner, FNP  aspirin-acetaminophen-caffeine (EXCEDRIN MIGRAINE) [phone] MG per tablet Take 2 tablets by mouth every 6 (six) hours as needed. Headache    [provider]  cimetidine (TAGAMET) 200 MG tablet Take 200 mg by mouth 2 (two) times daily.    [provider]  neomycin-polymyxin-hydrocortisone (CORTISPORIN) 3.5-10000-1 OTIC suspension Place 4 drops into the right ear 4 (four) times daily. 02/17/17   Lysbeth Penner, FNP  omeprazole (PRILOSEC) 20 MG capsule Take 20 mg by mouth daily.    [provider]  polyethylene glycol powder (GLYCOLAX/MIRALAX) powder Take 17 g by mouth daily. 06/20/15   Kandis Cocking A, CNM  predniSONE (STERAPRED UNI-PAK 21 TAB) 10 MG (21) TBPK tablet Take by mouth daily. Per box instruct 07/24/18   Zigmund Gottron, NP  tiZANidine (ZANAFLEX) 2 MG tablet Take 1 tablet (2 mg total) by mouth at bedtime. 07/24/18   Zigmund Gottron, NP    Family History Family History  Problem Relation Age of Onset  .  Hypertension Mother   . Hypertension Father   . Colon cancer Maternal Aunt   . Diabetes Maternal Aunt   . Colon polyps Maternal Aunt        x 2  . Colon polyps Cousin        x 2    Social History Social History   Tobacco Use  . Smoking status: Never Smoker  . Smokeless tobacco: Never Used  Substance Use Topics  . Alcohol use: Yes    Comment: wine once or twice a month  . Drug use: No     Allergies   Codeine, Hydrocodone, and Percocet [oxycodone-acetaminophen]   Review of Systems Review of Systems   Physical Exam Triage Vital Signs ED Triage Vitals  Enc Vitals Group     BP 07/24/18 1455 114/80     Pulse Rate 07/24/18 1455 69     Resp 07/24/18 1455 12     Temp 07/24/18 1455 99.5 F (37.5 C)     Temp Source 07/24/18 1455 Oral     SpO2 07/24/18 1455 100 %     Weight --      Height --      Head Circumference --      Peak Flow --      Pain Score  07/24/18 1451 8     Pain Loc --      Pain Edu? --      Excl. in [place]? --    No data found.  Updated Vital Signs BP 114/80 (BP Location: Right Arm)   Pulse 69   Temp 99.5 F (37.5 C) (Oral)   Resp 12   SpO2 100%    Physical Exam Constitutional:      General: She is not in acute distress.    Appearance: She is well-developed.  Cardiovascular:     Rate and Rhythm: Normal rate.  Pulmonary:     Effort: Pulmonary effort is normal.  Abdominal:     Tenderness: There is no abdominal tenderness. There is no right CVA tenderness or left CVA tenderness.  Musculoskeletal:     Lumbar back: She exhibits tenderness, bony tenderness and pain. She exhibits normal range of motion, no swelling, no edema, no deformity, no laceration, no spasm and normal pulse.       Back:     Comments: Mild midline pain to lumbar spine without step off or deformity; right low back with tenderness; pain exaggerated with transitioning from laying to sitting; "pulling" sensation to low back with left hip flexion and back flexion; negative straight leg raise; strength equal bilaterally; gross sensation intact to lower extremities   Skin:    General: Skin is warm and dry.  Neurological:     Mental Status: She is alert and oriented to person, place, and time.      UC Treatments / Results  Labs (all labs ordered are listed, but only abnormal results are displayed) Labs Reviewed  POCT URINALYSIS DIP (DEVICE)    EKG   Radiology No results found.  Procedures Procedures (including critical care time)  Medications Ordered in UC Medications - No data to display  Initial Impression / Assessment and Plan / UC Course  I have reviewed the triage vital signs and the nursing notes.  Pertinent labs & imaging results that were available during my care of the patient were reviewed by me and considered in my medical decision making (see chart for details).     Urine normal here today. Findings consistent with  musculoskeletal source of pain. Prednisone pack and muscle relaxer provider. Return precautions provided. Patient verbalized understanding and agreeable to plan.   Final Clinical Impressions(s) / UC Diagnoses   Final diagnoses:  Acute left-sided low back pain without sciatica     Discharge Instructions     Your urine is completely normal here today, which is reassuring for kidney etiology at this time.  Exam is concerning for muscular source of pain.  We will try a steroid pack to help with inflammation.  Don't take aleve or ibuprofen until this pack has been finished.  Muscle relaxer at night. May cause drowsiness. Please do not take if driving or drinking alcohol.   Light and regular activity as tolerated.  See exercises provided.  Heat application while active can help with muscle spasms.  Sleep with pillow under your knees.   Follow up with primary care provider as needed for persistent symptoms.      ED Prescriptions    Medication Sig Dispense Auth. Provider   predniSONE (STERAPRED UNI-PAK 21 TAB) 10 MG (21) TBPK tablet Take by mouth daily. Per box instruct 21 tablet Anasia Agro B, NP   tiZANidine (ZANAFLEX) 2 MG tablet Take 1 tablet (2 mg total) by mouth at bedtime. 20 tablet Zigmund Gottron, NP     Controlled Substance Prescriptions [place] Controlled Substance Registry consulted? Not Applicable   Zigmund Gottron, NP 07/24/18 [phone]

## 2018-11-25 ENCOUNTER — Encounter (HOSPITAL_COMMUNITY): Payer: Self-pay

## 2018-11-25 ENCOUNTER — Other Ambulatory Visit: Payer: Self-pay

## 2018-11-25 ENCOUNTER — Ambulatory Visit (HOSPITAL_COMMUNITY)
Admission: EM | Admit: 2018-11-25 | Discharge: 2018-11-25 | Disposition: A | Discharge status: Home / Self Care | Payer: BC Managed Care – PPO

## 2018-11-25 DIAGNOSIS — M25512 Pain in left shoulder: Secondary | ICD-10-CM | POA: Diagnosis not present

## 2018-11-25 MED ORDER — PREDNISONE 10 MG (21) PO TBPK: ORAL_TABLET | Freq: Every day | ORAL | 0 refills | Status: DC

## 2018-11-25 NOTE — ED Provider Notes (Signed)
[facility]    CSN: [id number] Arrival date & time: 11/25/18  1005      History   Chief Complaint Chief Complaint  Patient presents with  . Shoulder Pain    HPI Melinda Freeman is a 48 y.o. female.   Patient presents with left shoulder pain x approximately 4 months.  She denies trauma, falls, injury.  She states the pain is worse when she moves her arm in anterior adduction.  She states when she moves her left arm anteriorly across the front of her body to her right side, the pain radiates down to her left thumb and causes numbness.  She denies current weakness, numbness, tingling.  No treatments attempted at home.     The history is provided by the patient.    Past Medical History:  Diagnosis Date  . Chronic headaches   . GERD (gastroesophageal reflux disease)     Patient Active Problem List   Diagnosis Date Noted  . Constipation, chronic 06/20/2015  . Elevated blood pressure 06/20/2015    Past Surgical History:  Procedure Laterality Date  . ABDOMINAL HYSTERECTOMY    . kidney stones     x 3, also had a stent    OB History    Gravida  4   Para  4   Term  3   Preterm  1   AB      Living  4     SAB      TAB      Ectopic      Multiple      Live Births  4            Home Medications    Prior to Admission medications   Medication Sig Start Date End Date Taking? Authorizing Provider  VITAMIN D PO Take by mouth.   Yes [provider]  albuterol (PROVENTIL HFA;VENTOLIN HFA) 108 (90 Base) MCG/ACT inhaler Inhale 1-2 puffs into the lungs every 6 (six) hours as needed for wheezing or shortness of breath. 02/17/17   Lysbeth Penner, FNP  aspirin-acetaminophen-caffeine (EXCEDRIN MIGRAINE) [phone] MG per tablet Take 2 tablets by mouth every 6 (six) hours as needed. Headache    [provider]  cimetidine (TAGAMET) 200 MG tablet Take 200 mg by mouth 2 (two) times daily.    [provider]  naproxen sodium (ALEVE)  220 MG tablet Take 440 mg by mouth.    [provider]  neomycin-polymyxin-hydrocortisone (CORTISPORIN) 3.5-10000-1 OTIC suspension Place 4 drops into the right ear 4 (four) times daily. 02/17/17   Lysbeth Penner, FNP  omeprazole (PRILOSEC) 20 MG capsule Take 20 mg by mouth daily.    [provider]  polyethylene glycol powder (GLYCOLAX/MIRALAX) powder Take 17 g by mouth daily. 06/20/15   Kandis Cocking A, CNM  predniSONE (STERAPRED UNI-PAK 21 TAB) 10 MG (21) TBPK tablet Take by mouth daily. 11/25/18   Sharion Balloon, NP  tiZANidine (ZANAFLEX) 2 MG tablet Take 1 tablet (2 mg total) by mouth at bedtime. 07/24/18   Zigmund Gottron, NP  traZODone (DESYREL) 50 MG tablet Take 25 mg by mouth at bedtime.    [provider]    Family History Family History  Problem Relation Age of Onset  . Hypertension Mother   . Hypertension Father   . Colon cancer Maternal Aunt   . Diabetes Maternal Aunt   . Colon polyps Maternal Aunt        x 2  .  Colon polyps Cousin        x 2    Social History Social History   Tobacco Use  . Smoking status: Never Smoker  . Smokeless tobacco: Never Used  Substance Use Topics  . Alcohol use: Yes    Comment: wine once or twice a month  . Drug use: No     Allergies   Codeine, Hydrocodone, and Percocet [oxycodone-acetaminophen]   Review of Systems Review of Systems  Constitutional: Negative for chills and fever.  HENT: Negative for ear pain and sore throat.   Eyes: Negative for pain and visual disturbance.  Respiratory: Negative for cough and shortness of breath.   Cardiovascular: Negative for chest pain and palpitations.  Gastrointestinal: Negative for abdominal pain and vomiting.  Genitourinary: Negative for dysuria and hematuria.  Musculoskeletal: Positive for arthralgias. Negative for back pain.  Skin: Negative for color change and rash.  Neurological: Negative for seizures and syncope.  All other systems reviewed and are  negative.    Physical Exam Triage Vital Signs ED Triage Vitals  Enc Vitals Group     BP      Pulse      Resp      Temp      Temp src      SpO2      Weight      Height      Head Circumference      Peak Flow      Pain Score      Pain Loc      Pain Edu?      Excl. in [place]?    No data found.  Updated Vital Signs BP 121/79 (BP Location: Right Arm)   Pulse 73   Temp 98.8 F (37.1 C) (Oral)   Resp 16   SpO2 97%   Visual Acuity Right Eye Distance:   Left Eye Distance:   Bilateral Distance:    Right Eye Near:   Left Eye Near:    Bilateral Near:     Physical Exam Vitals signs and nursing note reviewed.  Constitutional:      General: She is not in acute distress.    Appearance: She is well-developed.  HENT:     Head: Normocephalic and atraumatic.     Mouth/Throat:     Mouth: Mucous membranes are moist.     Pharynx: Oropharynx is clear.  Eyes:     Conjunctiva/sclera: Conjunctivae normal.  Neck:     Musculoskeletal: Neck supple.  Cardiovascular:     Rate and Rhythm: Normal rate and regular rhythm.     Heart sounds: No murmur.  Pulmonary:     Effort: Pulmonary effort is normal. No respiratory distress.     Breath sounds: Normal breath sounds.  Abdominal:     General: Bowel sounds are normal.     Palpations: Abdomen is soft.     Tenderness: There is no abdominal tenderness. There is no guarding or rebound.  Musculoskeletal: Normal range of motion.        General: No swelling, tenderness, deformity or signs of injury.  Skin:    General: Skin is warm and dry.     Capillary Refill: Capillary refill takes less than 2 seconds.     Findings: No bruising, erythema, lesion or rash.  Neurological:     General: No focal deficit present.     Mental Status: She is alert and oriented to person, place, and time.     Sensory: No sensory deficit.  Motor: No weakness.      UC Treatments / Results  Labs (all labs ordered are listed, but only abnormal results are  displayed) Labs Reviewed - No data to display  EKG   Radiology No results found.  Procedures Procedures (including critical care time)  Medications Ordered in UC Medications - No data to display  Initial Impression / Assessment and Plan / UC Course  I have reviewed the triage vital signs and the nursing notes.  Pertinent labs & imaging results that were available during my care of the patient were reviewed by me and considered in my medical decision making (see chart for details).    Left shoulder pain.  Patient is well-appearing and her exam is unremarkable.  Treating with prednisone.  Instructed patient she can also take Tylenol or ibuprofen as needed.  Instructed her to call the orthopedic listed or her own orthopedic to schedule an appointment for evaluation.  Patient agrees to plan of care.     Final Clinical Impressions(s) / UC Diagnoses   Final diagnoses:  Acute pain of left shoulder     Discharge Instructions     Take the prednisone as directed.    You can also take Tylenol or ibuprofen as needed for discomfort.    Call the orthopedic listed below to schedule an appointment for evaluation if your symptoms persist.        ED Prescriptions    Medication Sig Dispense Auth. Provider   predniSONE (STERAPRED UNI-PAK 21 TAB) 10 MG (21) TBPK tablet Take by mouth daily. 21 tablet Sharion Balloon, NP     PDMP not reviewed this encounter.   Sharion Balloon, NP 11/25/18 1046

## 2018-11-25 NOTE — ED Triage Notes (Signed)
Pt states she is having left shoulder pain x 4 months when she try to extend her left arm or do an internal rotation. Pt states when she try to extend her left arm, she feels an electric shock radiating to her left thumb and numbness in her left thumb.

## 2018-11-25 NOTE — Discharge Instructions (Addendum)
Take the prednisone as directed.    You can also take Tylenol or ibuprofen as needed for discomfort.    Call the orthopedic listed below to schedule an appointment for evaluation if your symptoms persist.

## 2019-04-22 ENCOUNTER — Ambulatory Visit (INDEPENDENT_AMBULATORY_CARE_PROVIDER_SITE_OTHER)
Admission: RE | Admit: 2019-04-22 | Discharge: 2019-04-22 | Disposition: A | Discharge status: Home / Self Care | Payer: BC Managed Care – PPO | Source: Ambulatory Visit

## 2019-04-22 DIAGNOSIS — S46812A Strain of other muscles, fascia and tendons at shoulder and upper arm level, left arm, initial encounter: Secondary | ICD-10-CM

## 2019-04-22 MED ORDER — CYCLOBENZAPRINE HCL 5 MG PO TABS: 5.0000 mg | ORAL_TABLET | Freq: Two times a day (BID) | ORAL | 0 refills | Status: DC | PRN

## 2019-04-22 NOTE — ED Provider Notes (Signed)
Virtual Visit via Video Note:  Melinda Freeman  initiated request for Telemedicine visit with [facility]gent [facility] team. I connected with Melinda Freeman  on 04/22/2019 at 4:53 PM  for a synchronized telemedicine visit using a video enabled HIPPA compliant telemedicine application. I verified that I am speaking with Melinda Freeman  using two identifiers. Colonel Krauser C Teigen Bellin, PA-C  was physically located in a [facility] Urgent care site and Melinda Freeman was located at a different location.   The limitations of evaluation and management by telemedicine as well as the availability of in-person appointments were discussed. Patient was informed that she  may incur a bill ( including co-pay) for this virtual visit encounter. Melinda Freeman  expressed understanding and gave verbal consent to proceed with virtual visit.     History of Present Illness:Melinda Freeman  is a 49 y.o. female presents for evaluation of neck and back pain.  Patient notes that beginning Thursday after working she began to develop some discomfort in her back.  Over the past couple days she has had worsening stiffness and discomfort in her neck and upper back especially on the left side.  Has had sensations of spasming, but feels a dull aching constantly.  Denies any injury fall or trauma.  Denies heavy lifting.  Has been sitting still in a seated position for extended periods of time at work.  Has been trying to stretch areas out.  Denies numbness or tingling.  Has had some sensations of hot flashes with spasming.  Denies headaches or vision changes.   Allergies  Allergen Reactions  . Codeine     Fainting   . Hydrocodone Swelling  . Percocet [Oxycodone-Acetaminophen] Swelling     Past Medical History:  Diagnosis Date  . Chronic headaches   . GERD (gastroesophageal reflux disease)      Social History   Tobacco Use  . Smoking status: Never Smoker  . Smokeless tobacco: Never Used  Substance Use Topics  . Alcohol use: Yes   Comment: wine once or twice a month  . Drug use: No        Observations/Objective: Physical Exam  Constitutional: She is oriented to person, place, and time and well-developed, well-nourished, and in no distress. No distress.  HENT:  Head: Normocephalic and atraumatic.  Neck:  Patient actively moving neck in all directions pointing to left trapezius area as source of spasming  Pulmonary/Chest: Effort normal. No respiratory distress.  Speaking in full sentences  Musculoskeletal:     Cervical back: Normal range of motion.  Neurological: She is alert and oriented to person, place, and time.  Speech clear, face symmetric     Assessment and Plan:    ICD-10-CM   1. Trapezius muscle strain, left, initial encounter  S46.812A     Likely muscular strain and spasming of cervical/superior thoracic musculature.  Recommending continuing anti-inflammatories and muscle relaxers.  Providing Flexeril to supplement, using OTC Tylenol/ibuprofen.  Gentle stretching, alternate ice and heat.  Discussed strict return precautions. Patient verbalized understanding and is agreeable with plan.    Follow Up Instructions:     I discussed the assessment and treatment plan with the patient. The patient was provided an opportunity to ask questions and all were answered. The patient agreed with the plan and demonstrated an understanding of the instructions.   The patient was advised to call back or seek an in-person evaluation if the symptoms worsen or if the condition fails to improve as anticipated.  Janith Lima, PA-C  04/22/2019 4:53 PM         Janith Lima, PA-C 04/22/19 1654

## 2019-04-22 NOTE — Discharge Instructions (Addendum)
Use anti-inflammatories for pain/swelling. You may take up to 800 mg Ibuprofen every 8 hours with food. You may supplement Ibuprofen with Tylenol [phone] mg every 8 hours.   You may use flexeril as needed to help with pain. This is a muscle relaxer and causes sedation- please use only at bedtime or when you will be home and not have to drive/work  Gentle stretching

## 2019-04-24 ENCOUNTER — Ambulatory Visit (HOSPITAL_COMMUNITY)
Admission: EM | Admit: 2019-04-24 | Discharge: 2019-04-24 | Disposition: A | Discharge status: Home / Self Care | Payer: Self-pay | Attending: Urgent Care | Admitting: Urgent Care

## 2019-04-24 ENCOUNTER — Encounter (HOSPITAL_COMMUNITY): Payer: Self-pay

## 2019-04-24 ENCOUNTER — Other Ambulatory Visit: Payer: Self-pay

## 2019-04-24 DIAGNOSIS — M5412 Radiculopathy, cervical region: Secondary | ICD-10-CM

## 2019-04-24 DIAGNOSIS — M542 Cervicalgia: Secondary | ICD-10-CM

## 2019-04-24 DIAGNOSIS — M62830 Muscle spasm of back: Secondary | ICD-10-CM

## 2019-04-24 MED ORDER — TIZANIDINE HCL 4 MG PO TABS: 4.0000 mg | ORAL_TABLET | Freq: Three times a day (TID) | ORAL | 0 refills | Status: DC | PRN

## 2019-04-24 MED ORDER — PREDNISONE 20 MG PO TABS: ORAL_TABLET | ORAL | 0 refills | Status: DC

## 2019-04-24 NOTE — ED Triage Notes (Signed)
Pt presents with muscle spasm in neck, left shoulder and upper back that with movement it sends numbness down arm.

## 2019-04-24 NOTE — ED Provider Notes (Signed)
[facility]   MRN: [id number] DOB: March 31, 1970  Subjective:   Melinda Freeman is a 49 y.o. female presenting for several day history of acute onset moderate to severe mostly constant neck pain and left trapezius pain that radiates into her left arm all the way to the fingertips with associated numbness and tingling of set arm. No inciting factors the patient can recall. Denies falls, trauma. Denies weakness, loss of range of motion.   Current Facility-Administered Medications:  .  0.9 %  sodium chloride infusion, 500 mL, Intravenous, Continuous, Milus Banister, MD  Current Outpatient Medications:  .  albuterol (PROVENTIL HFA;VENTOLIN HFA) 108 (90 Base) MCG/ACT inhaler, Inhale 1-2 puffs into the lungs every 6 (six) hours as needed for wheezing or shortness of breath., Disp: 1 Inhaler, Rfl: 0 .  aspirin-acetaminophen-caffeine (EXCEDRIN MIGRAINE) 250-250-65 MG per tablet, Take 2 tablets by mouth every 6 (six) hours as needed. Headache, Disp: , Rfl:  .  cimetidine (TAGAMET) 200 MG tablet, Take 200 mg by mouth 2 (two) times daily., Disp: , Rfl:  .  cyclobenzaprine (FLEXERIL) 5 MG tablet, Take 1-2 tablets (5-10 mg total) by mouth 2 (two) times daily as needed for muscle spasms., Disp: 24 tablet, Rfl: 0 .  naproxen sodium (ALEVE) 220 MG tablet, Take 440 mg by mouth., Disp: , Rfl:  .  neomycin-polymyxin-hydrocortisone (CORTISPORIN) 3.5-10000-1 OTIC suspension, Place 4 drops into the right ear 4 (four) times daily., Disp: 10 mL, Rfl: 0 .  omeprazole (PRILOSEC) 20 MG capsule, Take 20 mg by mouth daily., Disp: , Rfl:  .  polyethylene glycol powder (GLYCOLAX/MIRALAX) powder, Take 17 g by mouth daily., Disp: 500 g, Rfl: 0 .  traZODone (DESYREL) 50 MG tablet, Take 25 mg by mouth at bedtime., Disp: , Rfl:  .  VITAMIN D PO, Take by mouth., Disp: , Rfl:    Allergies  Allergen Reactions  . Codeine     Fainting   . Hydrocodone Swelling  . Percocet [Oxycodone-Acetaminophen] Swelling    Past  Medical History:  Diagnosis Date  . Chronic headaches   . GERD (gastroesophageal reflux disease)      Past Surgical History:  Procedure Laterality Date  . ABDOMINAL HYSTERECTOMY    . kidney stones     x 3, also had a stent    Family History  Problem Relation Age of Onset  . Hypertension Mother   . Hypertension Father   . Colon cancer Maternal Aunt   . Diabetes Maternal Aunt   . Colon polyps Maternal Aunt        x 2  . Colon polyps Cousin        x 2    Social History   Tobacco Use  . Smoking status: Never Smoker  . Smokeless tobacco: Never Used  Substance Use Topics  . Alcohol use: Yes    Comment: wine once or twice a month  . Drug use: No    ROS   Objective:   Vitals: BP (!) 144/99 (BP Location: Left Arm)   Pulse 94   Temp 98.3 F (36.8 C) (Oral)   Resp 18   SpO2 100%   Physical Exam Constitutional:      General: She is not in acute distress.    Appearance: Normal appearance. She is well-developed. She is not ill-appearing, toxic-appearing or diaphoretic.  HENT:     Head: Normocephalic and atraumatic.     Nose: Nose normal.     Mouth/Throat:     Mouth: Mucous membranes  are moist.     Pharynx: Oropharynx is clear.  Eyes:     General: No scleral icterus.    Extraocular Movements: Extraocular movements intact.     Pupils: Pupils are equal, round, and reactive to light.  Cardiovascular:     Rate and Rhythm: Normal rate.  Pulmonary:     Effort: Pulmonary effort is normal.  Musculoskeletal:     Cervical back: Spasms (paraspinal muscles) and tenderness (along paraspinal muscles worse over left) present. No swelling, edema, deformity, erythema, signs of trauma, lacerations, rigidity, torticollis or crepitus. Pain with movement present. Decreased range of motion (full flexion and extension secondary to pain).       Back:  Skin:    General: Skin is warm and dry.  Neurological:     General: No focal deficit present.     Mental Status: She is alert and  oriented to person, place, and time.     Motor: No weakness.     Deep Tendon Reflexes: Reflexes normal.  Psychiatric:        Mood and Affect: Mood normal.        Behavior: Behavior normal.        Thought Content: Thought content normal.        Judgment: Judgment normal.     Assessment and Plan :   1. Cervical radicular pain   2. Muscle spasm of back   3. Neck pain     Will defer imaging given lack of trauma, physical exam findings supporting its need. Counseled patient on etiologies for cervical radiculopathy. Given severity of her symptoms, will use high-dose steroids as recommended by up-to-date., This with Tylenol and muscle relaxant. Provided patient with information to neurosurgery in the event her symptoms persist, counseled that she may benefit from having a diagnostic MRI. Counseled patient on potential for adverse effects with medications prescribed/recommended today, ER and return-to-clinic precautions discussed, patient verbalized understanding.    Jaynee Eagles, PA-C 04/24/19 1050

## 2020-03-07 ENCOUNTER — Telehealth: Payer: Self-pay | Admitting: Emergency Medicine

## 2020-03-07 DIAGNOSIS — T7840XA Allergy, unspecified, initial encounter: Secondary | ICD-10-CM

## 2020-03-07 NOTE — Progress Notes (Signed)
E visit for Allergic Rhinitis We are sorry that you are not feeling well.  Here is how we plan to help!  Based on what you have shared with me it looks like you have Allergic Rhinitis.  Rhinitis is when a reaction occurs that causes nasal congestion, runny nose, sneezing, and itching.  Most types of rhinitis are caused by an inflammation and are associated with symptoms in the eyes ears or throat. There are several types of rhinitis.  The most common are acute rhinitis, which is usually caused by a viral illness, allergic or seasonal rhinitis, and nonallergic or year-round rhinitis.  Nasal allergies occur certain times of the year.  Allergic rhinitis is caused when allergens in the air trigger the release of histamine in the body.  Histamine causes itching, swelling, and fluid to build up in the fragile linings of the nasal passages, sinuses and eyelids.  An itchy nose and clear discharge are common.  I recommend the following over the counter treatments: Xyzal 5 mg take 1 tablet daily or Benadryl 50 mg once every 8 hours.  I also would recommend a nasal spray: Flonase 2 sprays into each nostril once daily    HOME CARE:   You can use an over-the-counter saline nasal spray as needed  Avoid areas where there is heavy dust, mites, or molds  Stay indoors on windy days during the pollen season  Keep windows closed in home, at least in bedroom; use air conditioner.  Use high-efficiency house air filter  Keep windows closed in car, turn AC on re-circulate  Avoid playing out with dog during pollen season  GET HELP RIGHT AWAY IF:   If your symptoms do not improve within 10 days  You become short of breath  You develop yellow or green discharge from your nose for over 3 days  You have coughing fits  MAKE SURE YOU:   Understand these instructions  Will watch your condition  Will get help right away if you are not doing well or get worse  Thank you for choosing an  e-visit. Your e-visit answers were reviewed by a board certified advanced clinical practitioner to complete your personal care plan. Depending upon the condition, your plan could have included both over the counter or prescription medications. Please review your pharmacy choice. Be sure that the pharmacy you have chosen is open so that you can pick up your prescription now.  If there is a problem you may message your provider in [place] to have the prescription routed to another pharmacy. Your safety is important to [place]. If you have drug allergies check your prescription carefully.  For the next 24 hours, you can use MyChart to ask questions about today's visit, request a non-urgent call back, or ask for a work or school excuse from your e-visit provider. You will get an email in the next two days asking about your experience. I hope that your e-visit has been valuable and will speed your recovery.        Approximately 5 minutes was used in reviewing the patient's chart, questionnaire, prescribing medications, and documentation.

## 2020-06-17 ENCOUNTER — Other Ambulatory Visit: Payer: Self-pay | Admitting: Surgery

## 2020-06-18 ENCOUNTER — Encounter (HOSPITAL_BASED_OUTPATIENT_CLINIC_OR_DEPARTMENT_OTHER): Payer: Self-pay | Admitting: Surgery

## 2020-06-18 ENCOUNTER — Other Ambulatory Visit: Payer: Self-pay

## 2020-06-20 MED ORDER — ENSURE PRE-SURGERY PO LIQD: 296.0000 mL | Freq: Once | ORAL | Status: DC

## 2020-06-20 NOTE — Progress Notes (Signed)

## 2020-06-22 NOTE — H&P (Signed)
Melinda Freeman Appointment: 06/17/2020 2:20 PM Location: [facility] Patient #: [id number] DOB: 1970-02-25 Single / Language: Melinda Freeman / Race: Black or African American Female   History of Present Illness (Melinda Umar A. Ninfa Linden MD; 06/17/2020 2:45 PM) The patient is a 50 year old female who presents with a complaint of Mass.  Chief complaint: Right axillary mass  This is a 50 year old female who presents with a mass in her right axilla. She reports that has been present for several years. She had mammograms and ultrasound in 2017 showing a mass measuring 1.5 cm. It is now increased in size. It is now causing discomfort in the axilla. Her discomfort is moderate in severity. She denies nipple discharge. There is no family history of breast cancer. She is otherwise healthy.   Diagnostic Studies History Melinda Freeman, CMA; 06/17/2020 2:34 PM) Pap Smear  1-5 years ago  Allergies Melinda Freeman, CMA; 06/17/2020 2:35 PM) Codeine Phosphate *ANALGESICS - OPIOID*  HYDROcodone Bitartrate *CHEMICALS*   Medication History (Melinda Freeman, CMA; 06/17/2020 2:37 PM) Pantoprazole Sodium (40MG  Tablet DR, Oral) Active. Gabapentin (Oral) Specific strength unknown - Active. Famotidine (10MG  Tablet, Oral) Active. traZODone HCl (Oral) Specific strength unknown - Active. Medications Reconciled  Social History Melinda Freeman, CMA; 06/17/2020 2:34 PM) Alcohol use  Occasional alcohol use. No drug use  Tobacco use  Never smoker.  Family History Melinda Freeman, CMA; 06/17/2020 2:34 PM) Arthritis  Mother. Cerebrovascular Accident  Mother. Hypertension  Father, Mother. Migraine Headache  Father.  Pregnancy / Birth History Melinda Freeman, CMA; 06/17/2020 2:34 PM) Age of menopause  70-50 Gravida  5 Maternal age  32-20  Other Problems Melinda Freeman, CMA; 06/17/2020 2:34 PM) Gastroesophageal Reflux Disease  Lump In Breast     Review of  Systems Melinda Freeman R. Freeman CMA; 06/17/2020 2:34 PM) General Present- Night Sweats. Not Present- Appetite Loss, Chills, Fatigue, Fever, Weight Gain and Weight Loss. Skin Not Present- Change in Wart/Mole, Dryness, Hives, Jaundice, New Lesions, Non-Healing Wounds, Rash and Ulcer. Breast Present- Breast Mass. Not Present- Breast Pain, Nipple Discharge and Skin Changes. Cardiovascular Not Present- Chest Pain, Difficulty Breathing Lying Down, Leg Cramps, Palpitations, Rapid Heart Rate, Shortness of Breath and Swelling of Extremities. Gastrointestinal Not Present- Abdominal Pain, Bloating, Bloody Stool, Change in Bowel Habits, Chronic diarrhea, Constipation, Difficulty Swallowing, Excessive gas, Gets full quickly at meals, Hemorrhoids, Indigestion, Nausea, Rectal Pain and Vomiting. Neurological Present- Headaches. Not Present- Decreased Memory, Fainting, Numbness, Seizures, Tingling, Tremor, Trouble walking and Weakness. Endocrine Present- Hot flashes. Not Present- Cold Intolerance, Excessive Hunger, Hair Changes, Heat Intolerance and New Diabetes.  Vitals Melinda Freeman CMA; 06/17/2020 2:34 PM) 06/17/2020 2:33 PM Weight: 143 lb Height: 64in Body Surface Area: 1.7 m Body Mass Index: 24.55 kg/m  Pulse: 88 (Regular)  BP: 120/80(Sitting, Left Arm, Standard)       Physical Exam (Melinda Freeman A. Ninfa Linden MD; 06/17/2020 2:45 PM) The physical exam findings are as follows: Note: She appears well on exam.  In the right axilla, there is a superficial mass measuring about 5 cm in size. It is mobile and nonpulsatile. There is no deep axillary adenopathy. There are no skin changes. There are no palpable breast masses. Lungs clear CV RRR Abdomen soft, NT Skin without rash    Assessment & Plan (Matej Sappenfield A. Ninfa Linden MD; 06/17/2020 2:47 PM) AXILLARY MASS, RIGHT (R22.31) Impression: I reviewed her previous imaging. I reviewed her notes from her primary care provider. She does have a large right  axillary  mass. This may represent accessory breast tissue or lipoma but I cannot also rule out a superficial lymph node. Complete surgical excision is recommended for histology evaluation to rule out malignancy especially given the increase in size and her symptoms. I have discussed this with her in detail. We discussed the surgical procedure. We discussed the risks which includes but is not limited to bleeding, infection, injury to surrounding structures, seroma formation postoperatively, the need for further procedures, etc. We discussed postoperative recovery as well. She understands and wishes to proceed with surgery which will be scheduled.

## 2020-06-23 ENCOUNTER — Ambulatory Visit (HOSPITAL_BASED_OUTPATIENT_CLINIC_OR_DEPARTMENT_OTHER)
Admission: RE | Admit: 2020-06-23 | Discharge: 2020-06-23 | Disposition: A | Discharge status: Home / Self Care | Payer: Commercial Managed Care - PPO | Attending: Surgery | Admitting: Surgery

## 2020-06-23 ENCOUNTER — Encounter (HOSPITAL_BASED_OUTPATIENT_CLINIC_OR_DEPARTMENT_OTHER)
Admission: RE | Disposition: A | Discharge status: Home / Self Care | Payer: Self-pay | Source: Home / Self Care | Attending: Surgery

## 2020-06-23 ENCOUNTER — Ambulatory Visit (HOSPITAL_BASED_OUTPATIENT_CLINIC_OR_DEPARTMENT_OTHER): Payer: Commercial Managed Care - PPO | Admitting: Anesthesiology

## 2020-06-23 ENCOUNTER — Encounter (HOSPITAL_BASED_OUTPATIENT_CLINIC_OR_DEPARTMENT_OTHER): Payer: Self-pay | Admitting: Surgery

## 2020-06-23 ENCOUNTER — Other Ambulatory Visit: Payer: Self-pay

## 2020-06-23 DIAGNOSIS — R222 Localized swelling, mass and lump, trunk: Secondary | ICD-10-CM | POA: Diagnosis present

## 2020-06-23 DIAGNOSIS — Z79899 Other long term (current) drug therapy: Secondary | ICD-10-CM | POA: Insufficient documentation

## 2020-06-23 HISTORY — DX: Unspecified asthma, uncomplicated: J45.909

## 2020-06-23 HISTORY — DX: Personal history of urinary calculi: Z87.442

## 2020-06-23 SURGERY — EXCISION, MASS, UPPER EXTREMITY
Anesthesia: General | Laterality: Right

## 2020-06-23 MED ORDER — EPHEDRINE SULFATE 50 MG/ML IJ SOLN
INTRAMUSCULAR | Status: DC | PRN
  Administered 2020-06-23: 10 mg via INTRAVENOUS

## 2020-06-23 MED ORDER — DEXAMETHASONE SODIUM PHOSPHATE 10 MG/ML IJ SOLN
INTRAMUSCULAR | Status: AC
  Filled 2020-06-23: qty 1

## 2020-06-23 MED ORDER — BUPIVACAINE-EPINEPHRINE 0.5% -1:200000 IJ SOLN
INTRAMUSCULAR | Status: DC | PRN
  Administered 2020-06-23: 18 mL

## 2020-06-23 MED ORDER — FENTANYL CITRATE (PF) 100 MCG/2ML IJ SOLN
INTRAMUSCULAR | Status: AC
  Filled 2020-06-23: qty 2

## 2020-06-23 MED ORDER — CHLORHEXIDINE GLUCONATE CLOTH 2 % EX PADS: 6.0000 | MEDICATED_PAD | Freq: Once | CUTANEOUS | Status: DC

## 2020-06-23 MED ORDER — PHENYLEPHRINE HCL (PRESSORS) 10 MG/ML IV SOLN
INTRAVENOUS | Status: DC | PRN
  Administered 2020-06-23: 80 ug via INTRAVENOUS
  Administered 2020-06-23: 40 ug via INTRAVENOUS

## 2020-06-23 MED ORDER — MIDAZOLAM HCL 2 MG/2ML IJ SOLN
INTRAMUSCULAR | Status: AC
  Filled 2020-06-23: qty 2

## 2020-06-23 MED ORDER — MIDAZOLAM HCL 5 MG/5ML IJ SOLN
INTRAMUSCULAR | Status: DC | PRN
  Administered 2020-06-23: 2 mg via INTRAVENOUS

## 2020-06-23 MED ORDER — LACTATED RINGERS IV SOLN: INTRAVENOUS | Status: DC

## 2020-06-23 MED ORDER — LIDOCAINE HCL (PF) 2 % IJ SOLN
INTRAMUSCULAR | Status: AC
  Filled 2020-06-23: qty 5

## 2020-06-23 MED ORDER — LIDOCAINE HCL (CARDIAC) PF 100 MG/5ML IV SOSY
PREFILLED_SYRINGE | INTRAVENOUS | Status: DC | PRN
  Administered 2020-06-23: 50 mg via INTRATRACHEAL

## 2020-06-23 MED ORDER — TRAMADOL HCL 50 MG PO TABS: 50.0000 mg | ORAL_TABLET | Freq: Four times a day (QID) | ORAL | 0 refills | Status: DC | PRN

## 2020-06-23 MED ORDER — ONDANSETRON HCL 4 MG/2ML IJ SOLN
INTRAMUSCULAR | Status: AC
  Filled 2020-06-23: qty 2

## 2020-06-23 MED ORDER — PHENYLEPHRINE 40 MCG/ML (10ML) SYRINGE FOR IV PUSH (FOR BLOOD PRESSURE SUPPORT)
PREFILLED_SYRINGE | INTRAVENOUS | Status: AC
  Filled 2020-06-23: qty 10

## 2020-06-23 MED ORDER — EPHEDRINE 5 MG/ML INJ
INTRAVENOUS | Status: AC
  Filled 2020-06-23: qty 10

## 2020-06-23 MED ORDER — FENTANYL CITRATE (PF) 100 MCG/2ML IJ SOLN
INTRAMUSCULAR | Status: DC | PRN
  Administered 2020-06-23: 50 ug via INTRAVENOUS

## 2020-06-23 MED ORDER — DEXAMETHASONE SODIUM PHOSPHATE 10 MG/ML IJ SOLN
INTRAMUSCULAR | Status: DC | PRN
  Administered 2020-06-23: 5 mg via INTRAVENOUS

## 2020-06-23 MED ORDER — FENTANYL CITRATE (PF) 100 MCG/2ML IJ SOLN: 25.0000 ug | INTRAMUSCULAR | Status: DC | PRN

## 2020-06-23 MED ORDER — CEFAZOLIN SODIUM-DEXTROSE 2-4 GM/100ML-% IV SOLN
2.0000 g | INTRAVENOUS | Status: AC
  Administered 2020-06-23: 2 g via INTRAVENOUS

## 2020-06-23 MED ORDER — PROPOFOL 10 MG/ML IV BOLUS
INTRAVENOUS | Status: DC | PRN
  Administered 2020-06-23: 140 mg via INTRAVENOUS

## 2020-06-23 MED ORDER — CEFAZOLIN SODIUM-DEXTROSE 2-4 GM/100ML-% IV SOLN
INTRAVENOUS | Status: AC
  Filled 2020-06-23: qty 100

## 2020-06-23 MED ORDER — ONDANSETRON HCL 4 MG/2ML IJ SOLN
INTRAMUSCULAR | Status: DC | PRN
  Administered 2020-06-23: 4 mg via INTRAVENOUS

## 2020-06-23 MED ORDER — PROPOFOL 10 MG/ML IV BOLUS
INTRAVENOUS | Status: AC
  Filled 2020-06-23: qty 20

## 2020-06-23 MED ORDER — ONDANSETRON HCL 4 MG/2ML IJ SOLN: 4.0000 mg | Freq: Once | INTRAMUSCULAR | Status: DC | PRN

## 2020-06-23 SURGICAL SUPPLY — 45 items
ADH SKN CLS APL DERMABOND .7 (GAUZE/BANDAGES/DRESSINGS) ×1
APL PRP STRL LF DISP 70% ISPRP (MISCELLANEOUS) ×1
APPLIER CLIP 9.375 MED OPEN (MISCELLANEOUS) ×2
APR CLP MED 9.3 20 MLT OPN (MISCELLANEOUS) ×1
BLADE SURG 15 STRL LF DISP TIS (BLADE) ×1 IMPLANT
BLADE SURG 15 STRL SS (BLADE) ×2
CANISTER SUCT 1200ML W/VALVE (MISCELLANEOUS) ×2 IMPLANT
CHLORAPREP W/TINT 26 (MISCELLANEOUS) ×2 IMPLANT
CLIP APPLIE 9.375 MED OPEN (MISCELLANEOUS) ×1 IMPLANT
COVER BACK TABLE 60X90IN (DRAPES) ×2 IMPLANT
COVER MAYO STAND STRL (DRAPES) ×2 IMPLANT
COVER WAND RF STERILE (DRAPES) IMPLANT
DECANTER SPIKE VIAL GLASS SM (MISCELLANEOUS) IMPLANT
DERMABOND ADVANCED (GAUZE/BANDAGES/DRESSINGS) ×1
DERMABOND ADVANCED .7 DNX12 (GAUZE/BANDAGES/DRESSINGS) ×1 IMPLANT
DRAIN CHANNEL 19F RND (DRAIN) IMPLANT
DRAIN PENROSE 12X.25 LTX STRL (MISCELLANEOUS) IMPLANT
DRAPE LAPAROSCOPIC ABDOMINAL (DRAPES) IMPLANT
DRAPE LAPAROTOMY 100X72 PEDS (DRAPES) ×2 IMPLANT
DRAPE UTILITY XL STRL (DRAPES) ×2 IMPLANT
ELECT REM PT RETURN 9FT ADLT (ELECTROSURGICAL) ×2
ELECTRODE REM PT RTRN 9FT ADLT (ELECTROSURGICAL) ×1 IMPLANT
EVACUATOR SILICONE 100CC (DRAIN) IMPLANT
GLOVE SURG SIGNA 7.5 PF LTX (GLOVE) ×2 IMPLANT
GOWN STRL REUS W/ TWL LRG LVL3 (GOWN DISPOSABLE) ×1 IMPLANT
GOWN STRL REUS W/ TWL XL LVL3 (GOWN DISPOSABLE) ×2 IMPLANT
GOWN STRL REUS W/TWL LRG LVL3 (GOWN DISPOSABLE) ×2
GOWN STRL REUS W/TWL XL LVL3 (GOWN DISPOSABLE) ×4
NEEDLE HYPO 25X1 1.5 SAFETY (NEEDLE) ×2 IMPLANT
NS IRRIG 1000ML POUR BTL (IV SOLUTION) IMPLANT
PACK BASIN DAY SURGERY FS (CUSTOM PROCEDURE TRAY) ×2 IMPLANT
PENCIL SMOKE EVACUATOR (MISCELLANEOUS) ×2 IMPLANT
PIN SAFETY STERILE (MISCELLANEOUS) IMPLANT
SLEEVE SCD COMPRESS KNEE MED (STOCKING) ×2 IMPLANT
SPONGE LAP 4X18 RFD (DISPOSABLE) ×2 IMPLANT
STAPLER VISISTAT 35W (STAPLE) IMPLANT
SUT ETHILON 2 0 FS 18 (SUTURE) IMPLANT
SUT MNCRL AB 4-0 PS2 18 (SUTURE) ×2 IMPLANT
SUT VIC AB 3-0 SH 27 (SUTURE) ×2
SUT VIC AB 3-0 SH 27X BRD (SUTURE) ×1 IMPLANT
SYR BULB EAR ULCER 3OZ GRN STR (SYRINGE) IMPLANT
SYR CONTROL 10ML LL (SYRINGE) ×2 IMPLANT
TOWEL GREEN STERILE FF (TOWEL DISPOSABLE) ×2 IMPLANT
TUBE CONNECTING 20X1/4 (TUBING) ×2 IMPLANT
YANKAUER SUCT BULB TIP NO VENT (SUCTIONS) ×2 IMPLANT

## 2020-06-23 NOTE — Interval H&P Note (Signed)
History and Physical Interval Note:no change in H and P  06/23/2020 9:36 AM  Melinda Freeman  has presented today for surgery, with the diagnosis of RIGHT AXILLARY MASS.  The various methods of treatment have been discussed with the patient and family. After consideration of risks, benefits and other options for treatment, the patient has consented to  Procedure(s) with comments: EXCISION RIGHT AXILLARY MASS (Right) - LMA as a surgical intervention.  The patient's history has been reviewed, patient examined, no change in status, stable for surgery.  I have reviewed the patient's chart and labs.  Questions were answered to the patient's satisfaction.     Coralie Keens

## 2020-06-23 NOTE — Anesthesia Preprocedure Evaluation (Addendum)
Anesthesia Evaluation  Patient identified by MRN, date of birth, ID band Patient awake    Reviewed: Allergy & Precautions, NPO status , Patient's Chart, lab work & pertinent test results  Airway Mallampati: II  TM Distance: >3 FB Neck ROM: Full    Dental no notable dental hx. (+) Dental Advisory Given   Pulmonary asthma ,    Pulmonary exam normal breath sounds clear to auscultation       Cardiovascular negative cardio ROS Normal cardiovascular exam Rhythm:Regular Rate:Normal     Neuro/Psych  Headaches, negative psych ROS   GI/Hepatic Neg liver ROS, GERD  Medicated,  Endo/Other  negative endocrine ROS  Renal/GU negative Renal ROS  negative genitourinary   Musculoskeletal Right Axillary mass   Abdominal   Peds  Hematology negative hematology ROS (+)   Anesthesia Other Findings   Reproductive/Obstetrics                             Anesthesia Physical Anesthesia Plan  ASA: II  Anesthesia Plan: General   Post-op Pain Management:    Induction: Intravenous  PONV Risk Score and Plan: 4 or greater and Treatment may vary due to age or medical condition, Midazolam, Ondansetron and Dexamethasone  Airway Management Planned: LMA  Additional Equipment:   Intra-op Plan:   Post-operative Plan: Extubation in OR  Informed Consent: I have reviewed the patients History and Physical, chart, labs and discussed the procedure including the risks, benefits and alternatives for the proposed anesthesia with the patient or authorized representative who has indicated his/her understanding and acceptance.     Dental advisory given  Plan Discussed with: CRNA and Anesthesiologist  Anesthesia Plan Comments:         Anesthesia Quick Evaluation

## 2020-06-23 NOTE — Anesthesia Postprocedure Evaluation (Signed)
Anesthesia Post Note  Patient: Melinda Freeman  Procedure(s) Performed: EXCISION RIGHT AXILLARY MASS (Right )     Patient location during evaluation: PACU Anesthesia Type: General Level of consciousness: awake and alert and oriented Pain management: pain level controlled Vital Signs Assessment: post-procedure vital signs reviewed and stable Respiratory status: spontaneous breathing, nonlabored ventilation and respiratory function stable Cardiovascular status: blood pressure returned to baseline and stable Postop Assessment: no apparent nausea or vomiting Anesthetic complications: no   No complications documented.  Last Vitals:  Vitals:   06/23/20 1145 06/23/20 1200  BP: 114/72 117/79  Pulse: 82 74  Resp: 20 17  Temp:    SpO2: 100% 100%    Last Pain:  Vitals:   06/23/20 1200  TempSrc:   PainSc: 3                  Cinnamon Morency A.

## 2020-06-23 NOTE — Op Note (Signed)
Preop diagnosis: Right axillary mass Postop diagnosis: Same Procedure performed: Excision of right axillary mass  Surgeon: Coralie Keens, MD Assistant: Mosie Lukes, MD Anesthesia: Gen. via LMA  Indications: 50 year old female who presents with a mass in her right axilla. She reports that it has been present for several years. She had mammograms and ultrasound in 2017 showing a mass measuring 1.5 cm. It is now increased in size. It is now causing discomfort in the axilla. Her discomfort is moderate in severity. She denies nipple discharge. There is no family history of breast cancer. She is otherwise healthy.  Description of procedure: The patient was brought to the operating room and placed in a supine position on the operating room table. After an adequate level of general anesthesia was obtained, her right breast was prepped with ChloraPrep and draped in sterile fashion. A timeout was taken to ensure the proper patient and proper procedure. We identified the palpable mass in the right axilla. A 5cm elliptical incision was made overlying the mass and was carried down through the axillary tissue with cautery. An approximately 5x3x3cm specimen was fashioned including the abnormal tissue, which was excised and sent to pathology. The cavity was irrigated thoroughly and hemostasis was confirmed. The incision was then closed in layers with 3-0 vicryl interrupted sutures in the subcutaneous tissue, a running 4-0 biosyn subcuticular stitch, and skin glue.  All counts were correct at the end of the procedure. The patient tolerated the procedure well and was transported to the PACU in stable condition.

## 2020-06-23 NOTE — Discharge Instructions (Signed)
[place] Office Phone Number [phone]  BREAST BIOPSY/ PARTIAL MASTECTOMY: POST OP INSTRUCTIONS  Always review your discharge instruction sheet given to you by the facility where your surgery was performed.  IF YOU HAVE DISABILITY OR FAMILY LEAVE FORMS, YOU MUST BRING THEM TO THE OFFICE FOR PROCESSING.  DO NOT GIVE THEM TO YOUR DOCTOR.  1. A prescription for pain medication may be given to you upon discharge.  Take your pain medication as prescribed, if needed.  If narcotic pain medicine is not needed, then you may take acetaminophen (Tylenol) or ibuprofen (Advil) as needed. 2. Take your usually prescribed medications unless otherwise directed 3. If you need a refill on your pain medication, please contact your pharmacy.  They will contact our office to request authorization.  Prescriptions will not be filled after 5pm or on week-ends. 4. You should eat very light the first 24 hours after surgery, such as soup, crackers, pudding, etc.  Resume your normal diet the day after surgery. 5. Most patients will experience some swelling and bruising in the breast.  Ice packs and a good support bra will help.  Swelling and bruising can take several days to resolve.  6. It is common to experience some constipation if taking pain medication after surgery.  Increasing fluid intake and taking a stool softener will usually help or prevent this problem from occurring.  A mild laxative (Milk of Magnesia or Miralax) should be taken according to package directions if there are no bowel movements after 48 hours. 7. Unless discharge instructions indicate otherwise, you may remove your bandages 24-48 hours after surgery, and you may shower at that time.  You may have steri-strips (small skin tapes) in place directly over the incision.  These strips should be left on the skin for 7-10 days.  If your surgeon used skin glue on the incision, you may shower in 24 hours.  The glue will flake off over the  next 2-3 weeks.  Any sutures or staples will be removed at the office during your follow-up visit. 8. ACTIVITIES:  You may resume regular daily activities (gradually increasing) beginning the next day.  Wearing a good support bra or sports bra minimizes pain and swelling.  You may have sexual intercourse when it is comfortable. a. You may drive when you no longer are taking prescription pain medication, you can comfortably wear a seatbelt, and you can safely maneuver your car and apply brakes. b. RETURN TO WORK:  ______________________________________________________________________________________ 9. You should see your doctor in the office for a follow-up appointment approximately two weeks after your surgery.  Your doctor's nurse will typically make your follow-up appointment when she calls you with your pathology report.  Expect your pathology report 2-3 business days after your surgery.  You may call to check if you do not hear from [place] after three days. 10. OTHER INSTRUCTIONS:OK TO SHOWER STARTING TOMORROW 11. ICE PACK, TYLENOL, AND IBUPROFEN ALSO FOR PAIN 12. NO VIGOROUS ACTIVITY FOR 1 WEEK _______________________________________________________________________________________________ _____________________________________________________________________________________________________________________________________ _____________________________________________________________________________________________________________________________________ _____________________________________________________________________________________________________________________________________  WHEN TO CALL YOUR DOCTOR: 1. Fever over 101.0 2. Nausea and/or vomiting. 3. Extreme swelling or bruising. 4. Continued bleeding from incision. 5. Increased pain, redness, or drainage from the incision.  The clinic staff is available to answer your questions during regular business hours.  Please don't hesitate to  call and ask to speak to one of the nurses for clinical concerns.  If you have a medical emergency, go to the nearest emergency room or call 911.  A  surgeon from [facility] is always on call at the hospital.  For further questions, please visit [email or web address]    Post Anesthesia Home Care Instructions  Activity: Get plenty of rest for the remainder of the day. A responsible individual must stay with you for 24 hours following the procedure.  For the next 24 hours, DO NOT: -Drive a car -Paediatric nurse -Drink alcoholic beverages -Take any medication unless instructed by your physician -Make any legal decisions or sign important papers.  Meals: Start with liquid foods such as gelatin or soup. Progress to regular foods as tolerated. Avoid greasy, spicy, heavy foods. If nausea and/or vomiting occur, drink only clear liquids until the nausea and/or vomiting subsides. Call your physician if vomiting continues.  Special Instructions/Symptoms: Your throat may feel dry or sore from the anesthesia or the breathing tube placed in your throat during surgery. If this causes discomfort, gargle with warm salt water. The discomfort should disappear within 24 hours.  If you had a scopolamine patch placed behind your ear for the management of post- operative nausea and/or vomiting:  1. The medication in the patch is effective for 72 hours, after which it should be removed.  Wrap patch in a tissue and discard in the trash. Wash hands thoroughly with soap and water. 2. You may remove the patch earlier than 72 hours if you experience unpleasant side effects which may include dry mouth, dizziness or visual disturbances. 3. Avoid touching the patch. Wash your hands with soap and water after contact with the patch.

## 2020-06-23 NOTE — Anesthesia Procedure Notes (Signed)
Procedure Name: LMA Insertion Date/Time: 06/23/2020 10:56 AM Performed by: Glory Buff, CRNA Pre-anesthesia Checklist: Patient identified, Emergency Drugs available, Suction available and Patient being monitored Patient Re-evaluated:Patient Re-evaluated prior to induction Oxygen Delivery Method: Circle system utilized Preoxygenation: Pre-oxygenation with 100% oxygen Induction Type: IV induction LMA: LMA inserted LMA Size: 4.0 Number of attempts: 1 Placement Confirmation: positive ETCO2 Dental Injury: Teeth and Oropharynx as per pre-operative assessment

## 2020-06-23 NOTE — Transfer of Care (Signed)
Immediate Anesthesia Transfer of Care Note  Patient: Melinda Freeman  Procedure(s) Performed: EXCISION RIGHT AXILLARY MASS (Right )  Patient Location: PACU  Anesthesia Type:General  Level of Consciousness: drowsy  Airway & Oxygen Therapy: Patient Spontanous Breathing and Patient connected to face mask oxygen  Post-op Assessment: Report given to RN and Post -op Vital signs reviewed and stable  Post vital signs: Reviewed and stable  Last Vitals:  Vitals Value Taken Time  BP 110/68 06/23/20 1136  Temp    Pulse 87 06/23/20 1139  Resp 14 06/23/20 1139  SpO2 100 % 06/23/20 1139  Vitals shown include unvalidated device data.  Last Pain:  Vitals:   06/23/20 1001  TempSrc: Oral  PainSc: 0-No pain         Complications: No complications documented.

## 2020-06-26 LAB — SURGICAL PATHOLOGY

## 2020-08-10 ENCOUNTER — Telehealth: Payer: Commercial Managed Care - PPO | Admitting: Family

## 2020-08-10 ENCOUNTER — Encounter: Payer: Self-pay | Admitting: Family

## 2020-08-10 DIAGNOSIS — M5412 Radiculopathy, cervical region: Secondary | ICD-10-CM | POA: Diagnosis not present

## 2020-08-10 MED ORDER — DICLOFENAC SODIUM 75 MG PO TBEC: 75.0000 mg | DELAYED_RELEASE_TABLET | Freq: Two times a day (BID) | ORAL | 0 refills | Status: DC

## 2020-08-10 MED ORDER — BACLOFEN 10 MG PO TABS: 10.0000 mg | ORAL_TABLET | Freq: Three times a day (TID) | ORAL | 0 refills | Status: DC

## 2020-08-10 MED ORDER — PREDNISONE 10 MG (21) PO TBPK
ORAL_TABLET | ORAL | 0 refills | Status: DC
Start: 2020-08-10 — End: 2022-10-04

## 2020-08-10 NOTE — Progress Notes (Signed)
Virtual Visit Consent   Melinda Freeman, you are scheduled for a virtual visit with a [facility] provider today.     Just as with appointments in the office, your consent must be obtained to participate.  Your consent will be active for this visit and any virtual visit you may have with one of our providers in the next 365 days.     If you have a MyChart account, a copy of this consent can be sent to you electronically.  All virtual visits are billed to your insurance company just like a traditional visit in the office.    As this is a virtual visit, video technology does not allow for your provider to perform a traditional examination.  This may limit your provider's ability to fully assess your condition.  If your provider identifies any concerns that need to be evaluated in person or the need to arrange testing (such as labs, EKG, etc.), we will make arrangements to do so.     Although advances in technology are sophisticated, we cannot ensure that it will always work on either your end or our end.  If the connection with a video visit is poor, the visit may have to be switched to a telephone visit.  With either a video or telephone visit, we are not always able to ensure that we have a secure connection.     I need to obtain your verbal consent now.   Are you willing to proceed with your visit today?    Melinda Freeman has provided verbal consent on 08/10/2020 for a virtual visit (video or telephone).   [name], FNP   Date: 08/10/2020 10:52 AM   Virtual Visit via Video Note   I, [name], connected with  Melinda Freeman  (CM:[id number], 1970/05/13) on 08/10/20 at 10:45 AM EDT by a video-enabled telemedicine application and verified that I am speaking with the correct person using two identifiers.  Location: Patient: Virtual Visit Location Patient: Home Provider: Virtual Visit Location Provider: Home   I discussed the limitations of evaluation and management by telemedicine and the  availability of in person appointments. The patient expressed understanding and agreed to proceed.    History of Present Illness: Melinda Freeman is a 50 y.o. who identifies as a female who was assigned female at birth, and is being seen today for neck pain. She has had cervical radiculopathy in the past about a year ago. She reports over the last two weeks she has noticed increased pain and notices muscle spasm.   HPI: Neck Pain  This is a recurrent problem. The current episode started 1 to 4 weeks ago. The problem occurs constantly. The problem has been waxing and waning. The pain is associated with nothing. The pain is present in the left side. The quality of the pain is described as aching. The pain is at a severity of 6/10. The pain is moderate. Pertinent negatives include no numbness, pain with swallowing, syncope, tingling or trouble swallowing. She has tried NSAIDs and bed rest for the symptoms. The treatment provided mild relief.   Problems:  Patient Active Problem List   Diagnosis Date Noted   Constipation, chronic 06/20/2015   Elevated blood pressure 06/20/2015    Allergies:  Allergies  Allergen Reactions   Codeine     Fainting    Hydrocodone Swelling   Percocet [Oxycodone-Acetaminophen] Swelling   Medications:  Current Outpatient Medications:    baclofen (LIORESAL) 10 MG tablet, Take 1 tablet (10 mg total)  by mouth 3 (three) times daily., Disp: 30 each, Rfl: 0   diclofenac (VOLTAREN) 75 MG EC tablet, Take 1 tablet (75 mg total) by mouth 2 (two) times daily., Disp: 30 tablet, Rfl: 0   predniSONE (STERAPRED UNI-PAK 21 TAB) 10 MG (21) TBPK tablet, Use as directed, Disp: 21 tablet, Rfl: 0   albuterol (PROVENTIL HFA;VENTOLIN HFA) 108 (90 Base) MCG/ACT inhaler, Inhale 1-2 puffs into the lungs every 6 (six) hours as needed for wheezing or shortness of breath., Disp: 1 Inhaler, Rfl: 0   aspirin-acetaminophen-caffeine (EXCEDRIN MIGRAINE) 250-250-65 MG per tablet, Take 2 tablets by mouth  every 6 (six) hours as needed. Headache, Disp: , Rfl:    Collagen-Vitamin C-Biotin (COLLAGEN 1500/C PO), Take by mouth., Disp: , Rfl:    famotidine (PEPCID) 20 MG tablet, Take 20 mg by mouth 2 (two) times daily., Disp: , Rfl:    gabapentin (NEURONTIN) 300 MG capsule, Take 300 mg by mouth 3 (three) times daily as needed., Disp: , Rfl:    pantoprazole (PROTONIX) 40 MG tablet, Take 40 mg by mouth daily., Disp: , Rfl:    polyethylene glycol powder (GLYCOLAX/MIRALAX) powder, Take 17 g by mouth daily., Disp: 500 g, Rfl: 0   traMADol (ULTRAM) 50 MG tablet, Take 1 tablet (50 mg total) by mouth every 6 (six) hours as needed for moderate pain or severe pain., Disp: 25 tablet, Rfl: 0   traZODone (DESYREL) 50 MG tablet, Take 100 mg by mouth at bedtime., Disp: , Rfl:    vitamin B-12 (CYANOCOBALAMIN) 1000 MCG tablet, Take 1,000 mcg by mouth daily., Disp: , Rfl:    vitamin C (ASCORBIC ACID) 500 MG tablet, Take 500 mg by mouth daily., Disp: , Rfl:    VITAMIN D PO, Take 1,000 mg by mouth., Disp: , Rfl:   Observations/Objective: Patient is well-developed, well-nourished in no acute distress.  Resting comfortably at home.  Head is normocephalic, atraumatic.  No labored breathing. Speech is clear and coherent with logical content.  Patient is alert and oriented at baseline.  Left neck spasms   Assessment and Plan: 1. Cervical radiculopathy - diclofenac (VOLTAREN) 75 MG EC tablet; Take 1 tablet (75 mg total) by mouth 2 (two) times daily.  Dispense: 30 tablet; Refill: 0 - baclofen (LIORESAL) 10 MG tablet; Take 1 tablet (10 mg total) by mouth 3 (three) times daily.  Dispense: 30 each; Refill: 0 - predniSONE (STERAPRED UNI-PAK 21 TAB) 10 MG (21) TBPK tablet; Use as directed  Dispense: 21 tablet; Refill: 0 No other NSAID's while taking diclofenac  Rest Heat as needed ROM exercises encouraged  Follow Up Instructions: I discussed the assessment and treatment plan with the patient. The patient was provided an  opportunity to ask questions and all were answered. The patient agreed with the plan and demonstrated an understanding of the instructions.  A copy of instructions were sent to the patient via MyChart.  The patient was advised to call back or seek an in-person evaluation if the symptoms worsen or if the condition fails to improve as anticipated.  Time:  I spent 11 minutes with the patient via telehealth technology discussing the above problems/concerns.    [name], FNP

## 2020-08-25 ENCOUNTER — Other Ambulatory Visit: Payer: Self-pay

## 2020-08-25 DIAGNOSIS — M5412 Radiculopathy, cervical region: Secondary | ICD-10-CM

## 2020-08-29 ENCOUNTER — Encounter: Payer: Self-pay | Admitting: Gastroenterology

## 2021-07-10 ENCOUNTER — Telehealth: Payer: Self-pay | Admitting: Oncology

## 2021-07-22 ENCOUNTER — Telehealth: Payer: Self-pay | Admitting: Physician Assistant

## 2021-07-22 NOTE — Telephone Encounter (Signed)
R/s pt's new hem appt per 7/5 staff msg from Dr. Alen Blew. Pt is aware of new appt date and time.

## 2021-07-30 ENCOUNTER — Inpatient Hospital Stay: Payer: Commercial Managed Care - PPO | Admitting: Oncology

## 2021-08-10 NOTE — Progress Notes (Unsigned)
[place] [phone]   [phone]  CONSULT NOTE  REFERRING PHYSICIAN: Simona Huh  REASON FOR CONSULTATION:  Leukopenia   HPI Melinda Freeman is a 51 y.o. female with a past medical history significant for GERD, sickle cell trait, chronic muscle spasms/pain, and vitamin B-12 deficiency is referred to the clinic for leukopenia.   The patient had lab work performed on 06/06/2021 at her pain management clinic which showed leukopenia with a low white blood cell count at 2.7.  Her lymphocytes were within normal limits at 43.8% unclear with the absolute neutrophil and lymphocyte count was.  Her relative neutrophil count was low at 37.8%.  Her platelet count was within normal limits at 275.  When compared to September 07, 2020, the patient also had persistent low white blood cell count at that time at 2.9.  The patient was referred to the clinic for further evaluation and recommendations regarding these findings.  Overall, the oldest [facility] records I have are from 35 and 2019. She did not have any lab normalities at that time.   The patient started going to the pain management clinic after getting the first COVID-19 vaccine.  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  Therefore, she started taking Voltaren, baclofen, gabapentin, and trazodone daily.  Her has started sometime after 2019 which may coincide with when she started her chronic pain regimen which was after the COVID vaccine which was likely in late 2020 or 2021.  She also takes Pepcid, Prilosec, and 500 mg of vitamin B12.  She also takes vitamin C.  Besides the chronic muscle spasms, the patient is feeling well without any concerning complaints.  She reports she is going through menopause and has hot flashes and fatigue related to that.  The patient denies any history of bariatric surgery.  Denies any personal history of any autoimmune disorders such as Sjogren's,  rheumatoid arthritis, lupus, etc.  Denies any unusual rashes or joint swelling.  Denies any recent or frequent infections.  She does report she has nausea frequently which has been present "her whole life". She was tested for HIV in 2019 which was negative.  Denies any history of hepatitis, mono, bacterial, or parasitic infections.  She denies any fever, chills, or unexplained weight loss.  Denies any lymphadenopathy except she had a benign mass resected from her right axilla/breast last year.  She denies any herbal supplement use.  Denies any recurrent mouth ulcers.  Denies any history of liver disease.  Denies any gastrointestinal disease such as inflammatory bowel disease.  She used to be a vegetarian until 2022.   The patient denies any family history of any bone marrow disorders, blood disorders, or malignancies except for an Aunt who had colorectal cancer in her 63s.  The patient's mother also had lung cancer secondary to smoking.  The patient's father has migraines.  She has 2 brothers who are healthy except one of her brothers has migraines.  The patient works as a Secretary/administrator at [employer].  She is engaged.  She has 4 children and grandchildren.  Denies any tobacco or drug use.  She estimates she has 1-2 alcoholic beverages per month.    HPI  Past Medical History:  Diagnosis Date   Asthma    adult onset   Chronic headaches    GERD (gastroesophageal reflux disease)    History of kidney stones     Past Surgical History:  Procedure Laterality Date  ABDOMINAL HYSTERECTOMY     kidney stones     x 3, also had a stent    Family History  Problem Relation Age of Onset   Hypertension Mother    Hypertension Father    Colon cancer Maternal Aunt    Diabetes Maternal Aunt    Colon polyps Maternal Aunt        x 2   Colon polyps Cousin        x 2    Social History Social History   Tobacco Use   Smoking status: Never   Smokeless tobacco: Never  Substance Use Topics   Alcohol  use: Yes    Comment: wine once or twice a month   Drug use: No    Allergies  Allergen Reactions   Codeine     Fainting    Hydrocodone Swelling   Percocet [Oxycodone-Acetaminophen] Swelling    Current Outpatient Medications  Medication Sig Dispense Refill   albuterol (PROVENTIL HFA;VENTOLIN HFA) 108 (90 Base) MCG/ACT inhaler Inhale 1-2 puffs into the lungs every 6 (six) hours as needed for wheezing or shortness of breath. 1 Inhaler 0   aspirin-acetaminophen-caffeine (EXCEDRIN MIGRAINE) 500-938-18 MG per tablet Take 2 tablets by mouth every 6 (six) hours as needed. Headache     baclofen (LIORESAL) 10 MG tablet Take 1 tablet (10 mg total) by mouth 3 (three) times daily. 30 each 0   Collagen-Vitamin C-Biotin (COLLAGEN 1500/C PO) Take by mouth.     diclofenac (VOLTAREN) 75 MG EC tablet Take 1 tablet (75 mg total) by mouth 2 (two) times daily. 30 tablet 0   famotidine (PEPCID) 20 MG tablet Take 20 mg by mouth 2 (two) times daily.     gabapentin (NEURONTIN) 300 MG capsule Take 300 mg by mouth 3 (three) times daily as needed.     pantoprazole (PROTONIX) 40 MG tablet Take 40 mg by mouth daily.     polyethylene glycol powder (GLYCOLAX/MIRALAX) powder Take 17 g by mouth daily. 500 g 0   predniSONE (STERAPRED UNI-PAK 21 TAB) 10 MG (21) TBPK tablet Use as directed 21 tablet 0   traMADol (ULTRAM) 50 MG tablet Take 1 tablet (50 mg total) by mouth every 6 (six) hours as needed for moderate pain or severe pain. 25 tablet 0   traZODone (DESYREL) 50 MG tablet Take 100 mg by mouth at bedtime.     vitamin B-12 (CYANOCOBALAMIN) 1000 MCG tablet Take 1,000 mcg by mouth daily.     vitamin C (ASCORBIC ACID) 500 MG tablet Take 500 mg by mouth daily.     VITAMIN D PO Take 1,000 mg by mouth.     No current facility-administered medications for this visit.    REVIEW OF SYSTEMS:   Review of Systems  Constitutional: Positive for fatigue.  Negative for appetite change, chills,  fever and unexpected weight change.   HENT: Negative for mouth sores, nosebleeds, sore throat and trouble swallowing.   Eyes: Negative for eye problems and icterus.  Respiratory: Negative for cough, hemoptysis, shortness of breath and wheezing.   Cardiovascular: Negative for chest pain and leg swelling.  Gastrointestinal: Negative for abdominal pain, constipation, diarrhea, nausea and vomiting.  Genitourinary: Negative for bladder incontinence, difficulty urinating, dysuria, frequency and hematuria.   Musculoskeletal: Negative for back pain, gait problem, neck pain and neck stiffness.  Skin: Negative for itching and rash.  Neurological: Negative for dizziness, extremity weakness, gait problem, headaches, light-headedness and seizures.  Hematological: Negative for adenopathy. Does not bruise/bleed easily.  Psychiatric/Behavioral: Negative for confusion, depression and sleep disturbance. The patient is not nervous/anxious.     PHYSICAL EXAMINATION:  Blood pressure (!) 131/91, pulse 77, temperature 98.4 F (36.9 C), temperature source Oral, resp. rate 15, weight 151 lb 6.4 oz (68.7 kg), SpO2 100 %.  ECOG PERFORMANCE STATUS: 0  Physical Exam  Constitutional: Oriented to person, place, and time and well-developed, well-nourished, and in no distress.  HENT:  Head: Normocephalic and atraumatic.  Mouth/Throat: Oropharynx is clear and moist. No oropharyngeal exudate.  Eyes: Conjunctivae are normal. Right eye exhibits no discharge. Left eye exhibits no discharge. No scleral icterus.  Neck: Normal range of motion. Neck supple.  Cardiovascular: Normal rate, regular rhythm, normal heart sounds and intact distal pulses.   Pulmonary/Chest: Effort normal and breath sounds normal. No respiratory distress. No wheezes. No rales.  Abdominal: Soft. Bowel sounds are normal. Exhibits no distension and no mass. There is no tenderness.  Musculoskeletal: Normal range of motion. Exhibits no edema.  Lymphadenopathy:    No cervical adenopathy.   Neurological: Alert and oriented to person, place, and time. Exhibits normal muscle tone. Gait normal. Coordination normal.  Skin: Skin is warm and dry. No rash noted. Not diaphoretic. No erythema. No pallor.  Psychiatric: Mood, memory and judgment normal.  Vitals reviewed.  LABORATORY DATA: Lab Results  Component Value Date   WBC 3.8 (L) 08/12/2021   HGB 12.0 08/12/2021   HCT 35.4 (L) 08/12/2021   MCV 93.2 08/12/2021   PLT 219 08/12/2021      Chemistry      Component Value Date/Time   NA 137 08/12/2021 1336   NA 138 02/14/2017 1635   K 3.6 08/12/2021 1336   CL 102 08/12/2021 1336   CO2 28 08/12/2021 1336   BUN 11 08/12/2021 1336   BUN 8 02/14/2017 1635   CREATININE 0.78 08/12/2021 1336   CREATININE 0.85 02/19/2013 1732      Component Value Date/Time   CALCIUM 9.2 08/12/2021 1336   ALKPHOS 40 08/12/2021 1336   AST 23 08/12/2021 1336   ALT 25 08/12/2021 1336   BILITOT 0.6 08/12/2021 1336       RADIOGRAPHIC STUDIES: No results found.  ASSESSMENT: This is a very pleasant 51 year old African-American female referred to the clinic for leukopenia.  The patient had several lab studies today including a CBC, CMP, folate, B12, HIV, acute hepatitis, rheumatoid factor, ANA, and LDH performed today.   The patient's labs showed mild leukopenia with a white blood cell count slightly low at 3.8.  Her hemoglobin and platelet count is within normal limits.  Her absolute neutrophil count is within normal limits.  Her CMP is unremarkable.  Her LDH is within normal limits.  Her other lab studies are pending at this time.  Dr. Julien Nordmann feels that her leukopenia is likely secondary to her medication side effects.  This does correlate with the timeframe in which she likely started to develop her leukopenia.  The patient started taking these medications in after receiving her COVID-19 vaccine which was likely in 2020 or 2021.  She started having chronic muscle spasms and neuropathy after  that time for which she is followed by the pain clinic.  Per chart review we know her leukopenia started sometime after 2019 but was present in 2022.   Since the patient is asymptomatic from her leukopenia and that it is mild, Dr. Julien Nordmann does not recommend invasive work-up such as a bone marrow biopsy and aspirate at this time.  We will wait  for the results of her pending lab studies to see if there is any other etiologies contributing to her leukopenia.  We would only consider invasive work-up with bone marrow biopsy and aspirate if she has worsening leukopenia and/or develops recurrent/frequent infections.  We will see her back for follow-up visit in 3 months for evaluation and repeat blood work.  The patient does not necessarily need to stop taking her medications since she needs them for her symptoms control/quality of life.\  The patient voices understanding of current disease status and treatment options and is in agreement with the current care plan.  All questions were answered. The patient knows to call the clinic with any problems, questions or concerns. We can certainly see the patient much sooner if necessary.  Thank you so much for allowing me to participate in the care of Melinda Freeman. I will continue to follow up the patient with you and assist in her care.   Disclaimer: This note was dictated with voice recognition software. Similar sounding words can inadvertently be transcribed and may not be corrected upon review.   Melinda Freeman August 12, 2021, 3:34 PM  ADDENDUM: Hematology/Oncology Attending: I had a face-to-face encounter with the patient today.  I reviewed her records, lab and recommended her care plan.  This is a very pleasant 51 years old African-American female with history of sickle cell trait, GERD, chronic muscle spasms as well as vitamin B12 deficiency.  The patient was seen by her primary care provider and routine blood work on 06/06/2021 showed  leukopenia with total white blood count down to 2.7.  She had similar result in August 2022 with total white blood count of 2.9. The patient was referred to [place] today for evaluation and recommendation regarding her condition.  She is currently on several medications that may have contributed to her leukocytopenia including gabapentin and Voltaren. Repeat CBC today showed mild leukocytopenia with total white blood count of 3.8.  Her previous record over the last few years showed normal total white blood count.  On February 14, 2017 her total white blood count was 4.9 and on 06/20/2015 it was 5.0.  She also had CBC on 02/19/2013 that showed total white blood count of 6.1. We will order several other studies to rule out any other underlying etiology of her leukocytopenia but these labs are still pending and including acute hepatitis panel, HIV, ANA, rheumatoid factor, vitamin B12 and serum folate. We will see the patient back for follow-up visit in 3 months for evaluation and repeat blood work.  I also advised her if it is possible to decrease the dose of her culprit medication including the Voltaren and gabapentin. If she has any worsening of her condition, may consider The patient for a bone marrow biopsy and aspirate to rule out any other underlying etiology. The patient is in agreement with the current plan. She was advised to call immediately if she has any other concerning symptoms in the interval. The total time spent in the appointment was 60 minutes. Disclaimer: This note was dictated with voice recognition software. Similar sounding words can inadvertently be transcribed and may be missed upon review. Eilleen Kempf, MD

## 2021-08-11 ENCOUNTER — Other Ambulatory Visit: Payer: Self-pay | Admitting: Physician Assistant

## 2021-08-11 DIAGNOSIS — D72819 Decreased white blood cell count, unspecified: Secondary | ICD-10-CM

## 2021-08-12 ENCOUNTER — Inpatient Hospital Stay: Payer: BC Managed Care – PPO

## 2021-08-12 ENCOUNTER — Other Ambulatory Visit: Payer: Self-pay

## 2021-08-12 ENCOUNTER — Inpatient Hospital Stay: Payer: BC Managed Care – PPO | Attending: Oncology | Admitting: Physician Assistant

## 2021-08-12 VITALS — BP 131/91 | HR 77 | Temp 98.4°F | Resp 15 | Wt 151.4 lb

## 2021-08-12 DIAGNOSIS — G629 Polyneuropathy, unspecified: Secondary | ICD-10-CM | POA: Diagnosis not present

## 2021-08-12 DIAGNOSIS — Z90710 Acquired absence of both cervix and uterus: Secondary | ICD-10-CM | POA: Diagnosis not present

## 2021-08-12 DIAGNOSIS — M62838 Other muscle spasm: Secondary | ICD-10-CM | POA: Insufficient documentation

## 2021-08-12 DIAGNOSIS — D72819 Decreased white blood cell count, unspecified: Secondary | ICD-10-CM | POA: Insufficient documentation

## 2021-08-12 DIAGNOSIS — Z79899 Other long term (current) drug therapy: Secondary | ICD-10-CM | POA: Diagnosis not present

## 2021-08-12 DIAGNOSIS — Z800 Family history of malignant neoplasm of digestive organs: Secondary | ICD-10-CM | POA: Diagnosis not present

## 2021-08-12 DIAGNOSIS — D573 Sickle-cell trait: Secondary | ICD-10-CM | POA: Insufficient documentation

## 2021-08-12 LAB — HIV ANTIBODY (ROUTINE TESTING W REFLEX): HIV Screen 4th Generation wRfx: NONREACTIVE

## 2021-08-12 LAB — CBC WITH DIFFERENTIAL (CANCER CENTER ONLY)
Abs Immature Granulocytes: 0.01 10*3/uL (ref 0.00–0.07)
Basophils Absolute: 0.1 10*3/uL (ref 0.0–0.1)
Basophils Relative: 2 %
Eosinophils Absolute: 0.1 10*3/uL (ref 0.0–0.5)
Eosinophils Relative: 3 %
HCT: 35.4 % — ABNORMAL LOW (ref 36.0–46.0)
Hemoglobin: 12 g/dL (ref 12.0–15.0)
Immature Granulocytes: 0 %
Lymphocytes Relative: 39 %
Lymphs Abs: 1.5 10*3/uL (ref 0.7–4.0)
MCH: 31.6 pg (ref 26.0–34.0)
MCHC: 33.9 g/dL (ref 30.0–36.0)
MCV: 93.2 fL (ref 80.0–100.0)
Monocytes Absolute: 0.3 10*3/uL (ref 0.1–1.0)
Monocytes Relative: 7 %
Neutro Abs: 1.9 10*3/uL (ref 1.7–7.7)
Neutrophils Relative %: 49 %
Platelet Count: 219 10*3/uL (ref 150–400)
RBC: 3.8 MIL/uL — ABNORMAL LOW (ref 3.87–5.11)
RDW: 13 % (ref 11.5–15.5)
WBC Count: 3.8 10*3/uL — ABNORMAL LOW (ref 4.0–10.5)
nRBC: 0 % (ref 0.0–0.2)

## 2021-08-12 LAB — CMP (CANCER CENTER ONLY)
ALT: 25 U/L (ref 0–44)
AST: 23 U/L (ref 15–41)
Albumin: 4.5 g/dL (ref 3.5–5.0)
Alkaline Phosphatase: 40 U/L (ref 38–126)
Anion gap: 7 (ref 5–15)
BUN: 11 mg/dL (ref 6–20)
CO2: 28 mmol/L (ref 22–32)
Calcium: 9.2 mg/dL (ref 8.9–10.3)
Chloride: 102 mmol/L (ref 98–111)
Creatinine: 0.78 mg/dL (ref 0.44–1.00)
GFR, Estimated: >60 mL/min (ref >60)
Glucose, Bld: 110 mg/dL — ABNORMAL HIGH (ref 70–99)
Potassium: 3.6 mmol/L (ref 3.5–5.1)
Sodium: 137 mmol/L (ref 135–145)
Total Bilirubin: 0.6 mg/dL (ref 0.3–1.2)
Total Protein: 7.8 g/dL (ref 6.5–8.1)

## 2021-08-12 LAB — HEPATITIS PANEL, ACUTE
HCV Ab: NONREACTIVE
Hep A IgM: NONREACTIVE
Hep B C IgM: NONREACTIVE
Hepatitis B Surface Ag: NONREACTIVE

## 2021-08-12 LAB — LACTATE DEHYDROGENASE: LDH: 155 U/L (ref 98–192)

## 2021-08-12 LAB — FOLATE: Folate: 37.1 ng/mL (ref >5.9)

## 2021-08-12 LAB — VITAMIN B12: Vitamin B-12: 2680 pg/mL — ABNORMAL HIGH (ref 180–914)

## 2021-08-14 LAB — RHEUMATOID FACTOR: Rheumatoid fact SerPl-aCnc: 11 IU/mL (ref <14.0)

## 2021-08-14 LAB — ANTINUCLEAR ANTIBODIES, IFA: ANA Ab, IFA: NEGATIVE

## 2021-08-17 ENCOUNTER — Encounter: Payer: Self-pay | Admitting: Physician Assistant

## 2021-11-11 ENCOUNTER — Ambulatory Visit: Payer: BC Managed Care – PPO | Admitting: Physician Assistant

## 2021-11-11 ENCOUNTER — Other Ambulatory Visit: Payer: BC Managed Care – PPO

## 2021-11-18 ENCOUNTER — Inpatient Hospital Stay: Payer: BC Managed Care – PPO | Admitting: Physician Assistant

## 2021-11-18 ENCOUNTER — Inpatient Hospital Stay: Payer: BC Managed Care – PPO

## 2022-10-04 ENCOUNTER — Telehealth: Payer: Self-pay | Admitting: Physician Assistant

## 2022-10-04 DIAGNOSIS — M542 Cervicalgia: Secondary | ICD-10-CM

## 2022-10-04 MED ORDER — NAPROXEN 500 MG PO TABS: 500.0000 mg | ORAL_TABLET | Freq: Two times a day (BID) | ORAL | 0 refills | Status: DC

## 2022-10-04 MED ORDER — CYCLOBENZAPRINE HCL 10 MG PO TABS: 5.0000 mg | ORAL_TABLET | Freq: Three times a day (TID) | ORAL | 0 refills | Status: DC | PRN

## 2022-10-04 NOTE — Progress Notes (Signed)
We are sorry that you are not feeling well.  Here is how we plan to help!  Based on what you have shared with me it looks like you mostly have acute neck pain.  Acute neck pain is defined as musculoskeletal pain that can resolve in 1-3 weeks with conservative treatment.  I have prescribed Naprosyn 500 mg take one by mouth twice a day non-steroid anti-inflammatory (NSAID) as well as Flexeril 10 mg every eight hours as needed which is a muscle relaxer  Some patients experience stomach irritation or in increased heartburn with anti-inflammatory drugs.  Please keep in mind that muscle relaxer's can cause fatigue and should not be taken while at work or driving.  Back pain is very common.  The pain often gets better over time.  The cause of back pain is usually not dangerous.  Most people can learn to manage their back pain on their own.  Home Care Stay active.  Start with short walks on flat ground if you can.  Try to walk farther each day. Do not sit, drive or stand in one place for more than 30 minutes.  Do not stay in bed. Do not avoid exercise or work.  Activity can help your back heal faster. Be careful when you bend or lift an object.  Bend at your knees, keep the object close to you, and do not twist. Sleep on a firm mattress.  Lie on your side, and bend your knees.  If you lie on your back, put a pillow under your knees. Only take medicines as told by your doctor. Put ice on the injured area. Put ice in a plastic bag Place a towel between your skin and the bag Leave the ice on for 15-20 minutes, 3-4 times a day for the first 2-3 days. 210 After that, you can switch between ice and heat packs. Ask your doctor about back exercises or massage. Avoid feeling anxious or stressed.  Find good ways to deal with stress, such as exercise.  Get Help Right Way If: Your pain does not go away with rest or medicine. Your pain does not go away in 1 week. You have new problems. You do not feel  well. The pain spreads into your legs. You cannot control when you poop (bowel movement) or pee (urinate) You feel sick to your stomach (nauseous) or throw up (vomit) You have belly (abdominal) pain. You feel like you may pass out (faint). If you develop a fever.  Make Sure you: Understand these instructions. Will watch your condition Will get help right away if you are not doing well or get worse.  Your e-visit answers were reviewed by a board certified advanced clinical practitioner to complete your personal care plan.  Depending on the condition, your plan could have included both over the counter or prescription medications.  If there is a problem please reply  once you have received a response from your provider.  Your safety is important to [place].  If you have drug allergies check your prescription carefully.    You can use MyChart to ask questions about today's visit, request a non-urgent call back, or ask for a work or school excuse for 24 hours related to this e-Visit. If it has been greater than 24 hours you will need to follow up with your provider, or enter a new e-Visit to address those concerns.  You will get an e-mail in the next two days asking about your experience.  I hope that  your e-visit has been valuable and will speed your recovery. Thank you for using e-visits.  I have spent 5 minutes in review of e-visit questionnaire, review and updating patient chart, medical decision making and response to patient.   Margaretann Loveless, PA-C

## 2022-10-05 ENCOUNTER — Encounter: Payer: Self-pay | Admitting: Gastroenterology

## 2023-03-16 ENCOUNTER — Encounter: Payer: Self-pay | Admitting: Gastroenterology

## 2023-03-19 ENCOUNTER — Emergency Department (HOSPITAL_BASED_OUTPATIENT_CLINIC_OR_DEPARTMENT_OTHER): Admitting: Radiology

## 2023-03-19 ENCOUNTER — Encounter (HOSPITAL_BASED_OUTPATIENT_CLINIC_OR_DEPARTMENT_OTHER): Payer: Self-pay

## 2023-03-19 ENCOUNTER — Emergency Department (HOSPITAL_BASED_OUTPATIENT_CLINIC_OR_DEPARTMENT_OTHER)
Admission: EM | Admit: 2023-03-19 | Discharge: 2023-03-19 | Disposition: A | Discharge status: Home / Self Care | Attending: Emergency Medicine | Admitting: Emergency Medicine

## 2023-03-19 DIAGNOSIS — J45909 Unspecified asthma, uncomplicated: Secondary | ICD-10-CM | POA: Insufficient documentation

## 2023-03-19 DIAGNOSIS — M25511 Pain in right shoulder: Secondary | ICD-10-CM | POA: Diagnosis present

## 2023-03-19 DIAGNOSIS — Z7982 Long term (current) use of aspirin: Secondary | ICD-10-CM | POA: Insufficient documentation

## 2023-03-19 DIAGNOSIS — G8929 Other chronic pain: Secondary | ICD-10-CM | POA: Diagnosis not present

## 2023-03-19 NOTE — ED Provider Notes (Signed)
 [place] EMERGENCY DEPARTMENT AT [facility] Provider Note   CSN: [id number] Arrival date & time: 03/19/23  4098     History  Chief Complaint  Patient presents with   Shoulder Pain    Melinda Freeman is a 53 y.o. female.   Shoulder Pain  Patient is a 53 year old female presents the ED today complaining of 79-month history of right shoulder pain that she says happened after she lifted a heavy water jug and felt her arm dislocate and "pop back again".  Since then she says that she has been having difficulty with lateral abduction due to pain but is still able to have full range of motion.  Denies numbness, tingling, weakness, chest pain, shortness of breath, rash    Home Medications Prior to Admission medications   Medication Sig Start Date End Date Taking? Authorizing Provider  albuterol (PROVENTIL HFA;VENTOLIN HFA) 108 (90 Base) MCG/ACT inhaler Inhale 1-2 puffs into the lungs every 6 (six) hours as needed for wheezing or shortness of breath. 02/17/17   Deatra Canter, FNP  aspirin-acetaminophen-caffeine (EXCEDRIN MIGRAINE) [id number] MG per tablet Take 2 tablets by mouth every 6 (six) hours as needed. Headache    [provider]  Collagen-Vitamin C-Biotin (COLLAGEN 1500/C PO) Take by mouth.    [provider]  cyclobenzaprine (FLEXERIL) 10 MG tablet Take 0.5-1 tablets (5-10 mg total) by mouth 3 (three) times daily as needed. 10/04/22   Margaretann Loveless, PA-C  famotidine (PEPCID) 20 MG tablet Take 20 mg by mouth 2 (two) times daily.    [provider]  gabapentin (NEURONTIN) 300 MG capsule Take 300 mg by mouth 3 (three) times daily as needed.    [provider]  naproxen (NAPROSYN) 500 MG tablet Take 1 tablet (500 mg total) by mouth 2 (two) times daily with a meal. 10/04/22   Burnette, Alessandra Bevels, PA-C  pantoprazole (PROTONIX) 40 MG tablet Take 40 mg by mouth daily.    [provider]  polyethylene glycol powder  (GLYCOLAX/MIRALAX) powder Take 17 g by mouth daily. 06/20/15   Orvilla Cornwall A, CNM  traZODone (DESYREL) 50 MG tablet Take 100 mg by mouth at bedtime.    [provider]  vitamin B-12 (CYANOCOBALAMIN) 1000 MCG tablet Take 1,000 mcg by mouth daily.    [provider]  vitamin C (ASCORBIC ACID) 500 MG tablet Take 500 mg by mouth daily.    [provider]  VITAMIN D PO Take 1,000 mg by mouth.    [provider]      Allergies    Codeine, Hydrocodone, and Percocet [oxycodone-acetaminophen]    Review of Systems   Review of Systems  Musculoskeletal:  Positive for arthralgias.  All other systems reviewed and are negative.   Physical Exam Updated Vital Signs BP (!) 129/95 (BP Location: Right Arm)   Pulse 67   Temp 98 F (36.7 C) (Oral)   Resp 16   SpO2 100%  Physical Exam Vitals and nursing note reviewed.  Constitutional:      General: She is not in acute distress.    Appearance: Normal appearance. She is not ill-appearing.  HENT:     Head: Normocephalic and atraumatic.  Eyes:     General: No scleral icterus.       Right eye: No discharge.        Left eye: No discharge.     Extraocular Movements: Extraocular movements intact.     Conjunctiva/sclera: Conjunctivae normal.  Cardiovascular:  Rate and Rhythm: Normal rate and regular rhythm.     Pulses: Normal pulses.     Heart sounds: Normal heart sounds. No murmur heard.    No friction rub. No gallop.  Pulmonary:     Effort: Pulmonary effort is normal. No respiratory distress.     Breath sounds: Normal breath sounds.  Abdominal:     General: Abdomen is flat.     Palpations: Abdomen is soft.     Tenderness: There is no abdominal tenderness.  Musculoskeletal:        General: Tenderness (Glenohumeral joint mildly tender to palpation anteriorly.) present. No swelling, deformity or signs of injury. Normal range of motion.     Cervical back: Normal range of motion. No rigidity or tenderness.      Right lower leg: No edema.     Left lower leg: No edema.     Comments: Empty can test negative, Neer's test negative  Skin:    General: Skin is warm and dry.  Neurological:     General: No focal deficit present.     Mental Status: She is alert. Mental status is at baseline.  Psychiatric:        Mood and Affect: Mood normal.     ED Results / Procedures / Treatments   Labs (all labs ordered are listed, but only abnormal results are displayed) Labs Reviewed - No data to display  EKG None  Radiology DG Shoulder Right Result Date: 03/19/2023 CLINICAL DATA:  Pain to the right shoulder for 4 months. EXAM: RIGHT SHOULDER - 2+ VIEW COMPARISON:  None Available. FINDINGS: No signs of acute fracture or dislocation. Joint spaces are well preserved. Soft tissues are unremarkable. Visualized portions of the right lung appear clear. IMPRESSION: Negative. Electronically Signed   By: Signa Kell M.D.   On: 03/19/2023 10:06    Procedures Procedures    Medications Ordered in ED Medications - No data to display  ED Course/ Medical Decision Making/ A&P                                 Medical Decision Making Amount and/or Complexity of Data Reviewed Radiology: ordered.   Patient is a 53 year old female presents the ED today complaining of 21-month history of right shoulder pain that she says happened after she lifted a heavy water jug and felt her arm dislocate and "pop back again".  Since then she says that she has been having difficulty with lateral abduction due to pain but is still able to have full range of motion.  Denies numbness, tingling, weakness, chest pain, shortness of breath, rash   Physical exam was notable for pain with right arm abduction at the shoulder joint.  Mildly tender to palpation at the glenohumeral joint but was negative for empty can test and Neer's test.  No erythema.   Low suspicion for any emergent pathology present at this time.  X-ray of right shoulder was  unremarkable.  Will recommend she follow-up outpatient with orthopedics for possible rotator cuff tear/impingement syndrome and PT.  Patient vitals remained stable with the course of her time here.  I believe patient safe discharge at this time.  Differential diagnoses prior to evaluation: Rotator cuff tear, impingement syndrome, subclavian steal syndrome, fracture, ligamentous injury, cellulitis, septic arthritis  Past Medical History / Social History / Additional history: Chart reviewed. Pertinent results include: GERD, asthma, chronic headaches, nephrolithiasis  Medications / Treatment: No  treatment or medications were necessary for this visit.   Disposition: After consideration of the diagnostic results and the patients response to treatment, I feel that the patient went from treatment noted as above and discharge.   emergency department workup does not suggest an emergent condition requiring admission or immediate intervention beyond what has been performed at this time. The plan is: Follow-up with Ortho, follow-up with PCP, return for new or worsening symptoms. The patient is safe for discharge and has been instructed to return immediately for worsening symptoms, change in symptoms or any other concerns.  Final Clinical Impression(s) / ED Diagnoses Final diagnoses:  Chronic right shoulder pain    Rx / DC Orders ED Discharge Orders     None      Portions of this report may have been transcribed using voice recognition software. Every effort was made to ensure accuracy; however, inadvertent computerized transcription errors may be present.    Lunette Stands, PA-C 03/19/23 1116    Virgina Norfolk, DO 03/19/23 1243

## 2023-03-19 NOTE — Discharge Instructions (Signed)
 You were seen here today for right shoulder pain after a injury 6 months ago.  Recommend that you follow-up with Ortho for further evaluation and for possible PT.  I have provided to Ortho providers which you can call either 1 to schedule an appointment for a visit.  X-ray was unremarkable with no sign of fracture or dislocation at this time.  It was a pleasure seeing you in the ER today.

## 2023-04-05 ENCOUNTER — Ambulatory Visit (AMBULATORY_SURGERY_CENTER): Payer: 59 | Admitting: *Deleted

## 2023-04-05 VITALS — Ht 64.0 in | Wt 162.0 lb

## 2023-04-05 DIAGNOSIS — Z800 Family history of malignant neoplasm of digestive organs: Secondary | ICD-10-CM

## 2023-04-05 DIAGNOSIS — Z860100 Personal history of colon polyps, unspecified: Secondary | ICD-10-CM

## 2023-04-05 MED ORDER — SUFLAVE 178.7 G PO SOLR: 1.0000 | Freq: Once | ORAL | 0 refills | Status: AC

## 2023-04-05 NOTE — Progress Notes (Signed)
 Pt's name and DOB verified at the beginning of the pre-visit wit 2 identifiers  Pt denies any difficulty with ambulating,sitting, laying down or rolling side to side  Pt has no issues with ambulation   Pt has no issues moving head neck or swallowing  No egg or soy allergy known to patient   No issues known to pt with past sedation with any surgeries or procedures  Pt denies having issues being intubated  No FH of Malignant Hyperthermia  Pt is not on diet pills or shots  Pt is not on home 02   Pt is not on blood thinners   Pt has frequent issues with constipation RN instructed pt to use Miralax per bottles instructions a week before prep days. Pt states they will  Pt is not on dialysis  Pt denise any abnormal heart rhythms   Pt denies any upcoming cardiac testing  Patient's chart reviewed by Cathlyn Parsons CNRA prior to pre-visit and patient appropriate for the [facility].  Pre-visit completed and red dot placed by patient's name on their procedure day (on provider's schedule).    Chart not reviewed by CRNA prior to [facility]  Visit in person  Pt states weight is 162 lb  IInstructions reviewed. Pt given [facility], [facility] main # and MD on call # prior to instructions.  Pt states understanding of instructions. Instructed to review again prior to procedure. Pt states they will.   Informed pt that they will receive a text or  call from [facility] regarding there prep med.

## 2023-04-19 ENCOUNTER — Encounter: Payer: 59 | Admitting: Gastroenterology

## 2023-05-18 ENCOUNTER — Encounter: Payer: Self-pay | Admitting: Gastroenterology

## 2023-05-22 ENCOUNTER — Encounter: Payer: Self-pay | Admitting: Gastroenterology

## 2023-05-27 ENCOUNTER — Encounter: Payer: Self-pay | Admitting: Gastroenterology

## 2023-05-27 ENCOUNTER — Ambulatory Visit (AMBULATORY_SURGERY_CENTER): Admitting: Gastroenterology

## 2023-05-27 VITALS — BP 118/77 | HR 64 | Temp 97.2°F | Resp 14 | Ht 64.0 in | Wt 162.0 lb

## 2023-05-27 DIAGNOSIS — K640 First degree hemorrhoids: Secondary | ICD-10-CM

## 2023-05-27 DIAGNOSIS — Z1211 Encounter for screening for malignant neoplasm of colon: Secondary | ICD-10-CM | POA: Diagnosis present

## 2023-05-27 DIAGNOSIS — Z860101 Personal history of adenomatous and serrated colon polyps: Secondary | ICD-10-CM | POA: Diagnosis not present

## 2023-05-27 DIAGNOSIS — Z860100 Personal history of colon polyps, unspecified: Secondary | ICD-10-CM

## 2023-05-27 DIAGNOSIS — Z83719 Family history of colon polyps, unspecified: Secondary | ICD-10-CM | POA: Diagnosis not present

## 2023-05-27 DIAGNOSIS — K5730 Diverticulosis of large intestine without perforation or abscess without bleeding: Secondary | ICD-10-CM | POA: Diagnosis not present

## 2023-05-27 DIAGNOSIS — K644 Residual hemorrhoidal skin tags: Secondary | ICD-10-CM | POA: Diagnosis not present

## 2023-05-27 MED ORDER — SODIUM CHLORIDE 0.9 % IV SOLN: 500.0000 mL | Freq: Once | INTRAVENOUS | Status: DC

## 2023-05-27 NOTE — Progress Notes (Signed)
 GASTROENTEROLOGY PROCEDURE H&P NOTE   Primary Care Physician: Earlis Glimpse, NP  HPI: Melinda Freeman is a 53 y.o. female who presents for FHx Colon polyps and personal history of adenomas.  Past Medical History:  Diagnosis Date   Asthma    adult onset   Chronic headaches    Chronic kidney disease    Constipation    GERD (gastroesophageal reflux disease)    History of kidney stones    Leukopenia    Past Surgical History:  Procedure Laterality Date   ABDOMINAL HYSTERECTOMY     COLONOSCOPY     kidney stones     x 3, also had a stent   Current Outpatient Medications  Medication Sig Dispense Refill   albuterol  (PROVENTIL  HFA;VENTOLIN  HFA) 108 (90 Base) MCG/ACT inhaler Inhale 1-2 puffs into the lungs every 6 (six) hours as needed for wheezing or shortness of breath. 1 Inhaler 0   aspirin-acetaminophen-caffeine (EXCEDRIN MIGRAINE) 250-250-65 MG per tablet Take 2 tablets by mouth every 6 (six) hours as needed. Headache     baclofen  (LIORESAL ) 10 MG tablet Take 10 mg by mouth 3 (three) times daily as needed.     Collagen-Vitamin C-Biotin (COLLAGEN 1500/C PO) Take by mouth.     cyclobenzaprine  (FLEXERIL ) 10 MG tablet Take 0.5-1 tablets (5-10 mg total) by mouth 3 (three) times daily as needed. (Patient not taking: Reported on 04/05/2023) 30 tablet 0   etodolac (LODINE XL) 600 MG 24 hr tablet Take 600 mg by mouth daily. As needed     famotidine  (PEPCID ) 20 MG tablet Take 20 mg by mouth 2 (two) times daily.     polyethylene glycol powder (GLYCOLAX /MIRALAX ) powder Take 17 g by mouth daily. 500 g 0   traZODone (DESYREL) 50 MG tablet Take 100 mg by mouth at bedtime.     vitamin B-12 (CYANOCOBALAMIN) 1000 MCG tablet Take 1,000 mcg by mouth daily. (Patient not taking: Reported on 04/05/2023)     vitamin C (ASCORBIC ACID) 500 MG tablet Take 500 mg by mouth daily. (Patient not taking: Reported on 04/05/2023)     VITAMIN D PO Take 1,000 mg by mouth.     No current facility-administered  medications for this visit.    Current Outpatient Medications:    albuterol  (PROVENTIL  HFA;VENTOLIN  HFA) 108 (90 Base) MCG/ACT inhaler, Inhale 1-2 puffs into the lungs every 6 (six) hours as needed for wheezing or shortness of breath., Disp: 1 Inhaler, Rfl: 0   aspirin-acetaminophen-caffeine (EXCEDRIN MIGRAINE) 250-250-65 MG per tablet, Take 2 tablets by mouth every 6 (six) hours as needed. Headache, Disp: , Rfl:    baclofen  (LIORESAL ) 10 MG tablet, Take 10 mg by mouth 3 (three) times daily as needed., Disp: , Rfl:    Collagen-Vitamin C-Biotin (COLLAGEN 1500/C PO), Take by mouth., Disp: , Rfl:    cyclobenzaprine  (FLEXERIL ) 10 MG tablet, Take 0.5-1 tablets (5-10 mg total) by mouth 3 (three) times daily as needed. (Patient not taking: Reported on 04/05/2023), Disp: 30 tablet, Rfl: 0   etodolac (LODINE XL) 600 MG 24 hr tablet, Take 600 mg by mouth daily. As needed, Disp: , Rfl:    famotidine  (PEPCID ) 20 MG tablet, Take 20 mg by mouth 2 (two) times daily., Disp: , Rfl:    polyethylene glycol powder (GLYCOLAX /MIRALAX ) powder, Take 17 g by mouth daily., Disp: 500 g, Rfl: 0   traZODone (DESYREL) 50 MG tablet, Take 100 mg by mouth at bedtime., Disp: , Rfl:    vitamin B-12 (CYANOCOBALAMIN) 1000 MCG tablet, Take  1,000 mcg by mouth daily. (Patient not taking: Reported on 04/05/2023), Disp: , Rfl:    vitamin C (ASCORBIC ACID) 500 MG tablet, Take 500 mg by mouth daily. (Patient not taking: Reported on 04/05/2023), Disp: , Rfl:    VITAMIN D PO, Take 1,000 mg by mouth., Disp: , Rfl:  Allergies  Allergen Reactions   Codeine     Fainting    Hydrocodone Swelling   Percocet [Oxycodone-Acetaminophen] Swelling   Family History  Problem Relation Age of Onset   Hypertension Mother    Hypertension Father    Colon cancer Maternal Aunt    Diabetes Maternal Aunt    Colon polyps Maternal Aunt        x 2   Colon polyps Cousin        x 2   Stomach cancer Neg Hx    Rectal cancer Neg Hx    Esophageal cancer Neg Hx     Social History   Socioeconomic History   Marital status: Single    Spouse name: Not on file   Number of children: 4   Years of education: Not on file   Highest education level: Not on file  Occupational History   Occupation: customer service rep  Tobacco Use   Smoking status: Never   Smokeless tobacco: Never  Substance and Sexual Activity   Alcohol use: Yes    Comment: wine once or twice a month   Drug use: No   Sexual activity: Yes    Birth control/protection: Surgical  Other Topics Concern   Not on file  Social History Narrative   Not on file   Social Drivers of Health   Financial Resource Strain: Not on file  Food Insecurity: Not on file  Transportation Needs: Not on file  Physical Activity: Not on file  Stress: Not on file  Social Connections: Not on file  Intimate Partner Violence: Not on file    Physical Exam: There were no vitals filed for this visit. There is no height or weight on file to calculate BMI. GEN: NAD EYE: Sclerae anicteric ENT: MMM CV: Non-tachycardic GI: Soft, NT/ND NEURO:  Alert & Oriented x 3  Lab Results: No results for input(s): "WBC", "HGB", "HCT", "PLT" in the last 72 hours. BMET No results for input(s): "NA", "K", "CL", "CO2", "GLUCOSE", "BUN", "CREATININE", "CALCIUM" in the last 72 hours. LFT No results for input(s): "PROT", "ALBUMIN", "AST", "ALT", "ALKPHOS", "BILITOT", "BILIDIR", "IBILI" in the last 72 hours. PT/INR No results for input(s): "LABPROT", "INR" in the last 72 hours.   Impression / Plan: This is a 53 y.o.female  who presents for FHx Colon polyps and personal history of adenomas.  The risks and benefits of endoscopic evaluation/treatment were discussed with the patient and/or family; these include but are not limited to the risk of perforation, infection, bleeding, missed lesions, lack of diagnosis, severe illness requiring hospitalization, as well as anesthesia and sedation related illnesses.  The patient's  history has been reviewed, patient examined, no change in status, and deemed stable for procedure.  The patient and/or family is agreeable to proceed.    Yong Henle, MD [facility]oscopy Office # [phone]

## 2023-05-27 NOTE — Progress Notes (Signed)
 Pt's states no medical or surgical changes since previsit or office visit.

## 2023-05-27 NOTE — Patient Instructions (Addendum)
-   High fiber diet. - Use FiberCon 1-2 tablets PO daily. - Continue present medications. - Repeat colonoscopy in 7 years for surveillance due to family history and personal history of colon polyps.  YOU HAD AN ENDOSCOPIC PROCEDURE TODAY AT THE [facility]:   Refer to the procedure report that was given to you for any specific questions about what was found during the examination.  If the procedure report does not answer your questions, please call your gastroenterologist to clarify.  If you requested that your care partner not be given the details of your procedure findings, then the procedure report has been included in a sealed envelope for you to review at your convenience later.  YOU SHOULD EXPECT: Some feelings of bloating in the abdomen. Passage of more gas than usual.  Walking can help get rid of the air that was put into your GI tract during the procedure and reduce the bloating. If you had a lower endoscopy (such as a colonoscopy or flexible sigmoidoscopy) you may notice spotting of blood in your stool or on the toilet paper. If you underwent a bowel prep for your procedure, you may not have a normal bowel movement for a few days.  Please Note:  You might notice some irritation and congestion in your nose or some drainage.  This is from the oxygen used during your procedure.  There is no need for concern and it should clear up in a day or so.  SYMPTOMS TO REPORT IMMEDIATELY:  Following lower endoscopy (colonoscopy or flexible sigmoidoscopy):  Excessive amounts of blood in the stool  Significant tenderness or worsening of abdominal pains  Swelling of the abdomen that is new, acute  Fever of 100F or higher  For urgent or emergent issues, a gastroenterologist can be reached at any hour by calling [phone]. Do not use MyChart messaging for urgent concerns.    DIET:  We do recommend a small meal at first, but then you may proceed to your regular diet.  Drink plenty of  fluids but you should avoid alcoholic beverages for 24 hours.  ACTIVITY:  You should plan to take it easy for the rest of today and you should NOT DRIVE or use heavy machinery until tomorrow (because of the sedation medicines used during the test).    FOLLOW UP: Our staff will call the number listed on your records the next business day following your procedure.  We will call around 7:15- 8:00 am to check on you and address any questions or concerns that you may have regarding the information given to you following your procedure. If we do not reach you, we will leave a message.     If any biopsies were taken you will be contacted by phone or by letter within the next 1-3 weeks.  Please call us  at [phone] if you have not heard about the biopsies in 3 weeks.    SIGNATURES/CONFIDENTIALITY: You and/or your care partner have signed paperwork which will be entered into your electronic medical record.  These signatures attest to the fact that that the information above on your After Visit Summary has been reviewed and is understood.  Full responsibility of the confidentiality of this discharge information lies with you and/or your care-partner.

## 2023-05-27 NOTE — Op Note (Signed)
 [facility] Patient Name: Melinda Freeman Procedure Date: 05/27/2023 2:18 PM MRN: [id number] Endoscopist: Yong Henle , MD, [id number] Age: 53 Referring MD:  Date of Birth: 1970-05-08 Gender: Female Account #: [id number] Procedure:                Colonoscopy Indications:              Colon cancer screening in patient at increased                            risk: Family history of colon polyps in multiple                            1st-degree relatives, High risk colon cancer                            surveillance: Personal history of non-advanced                            adenoma Medicines:                Monitored Anesthesia Care Procedure:                Pre-Anesthesia Assessment:                           - Prior to the procedure, a History and Physical                            was performed, and patient medications and                            allergies were reviewed. The patient's tolerance of                            previous anesthesia was also reviewed. The risks                            and benefits of the procedure and the sedation                            options and risks were discussed with the patient.                            All questions were answered, and informed consent                            was obtained. Prior Anticoagulants: The patient has                            taken no anticoagulant or antiplatelet agents. ASA                            Grade Assessment: II - A patient with mild systemic  disease. After reviewing the risks and benefits,                            the patient was deemed in satisfactory condition to                            undergo the procedure.                           After obtaining informed consent, the colonoscope                            was passed under direct vision. Throughout the                            procedure, the patient's blood pressure, pulse, and                             oxygen saturations were monitored continuously. The                            CF [id number] #[id number] was introduced through the anus                            and advanced to the 3 cm into the ileum. The                            colonoscopy was performed without difficulty. The                            patient tolerated the procedure. The quality of the                            bowel preparation was adequate. The terminal ileum,                            ileocecal valve, appendiceal orifice, and rectum                            were photographed. Scope In: 2:34:16 PM Scope Out: 2:45:40 PM Scope Withdrawal Time: 0 hours 9 minutes 3 seconds  Total Procedure Duration: 0 hours 11 minutes 24 seconds  Findings:                 Skin tags were found on perianal exam.                           The digital rectal exam was normal. Pertinent                            negatives include no palpable rectal lesions.                           The terminal ileum and ileocecal valve appeared  normal.                           Multiple small-mouthed diverticula were found in                            the entire colon (greatest burden in the left                            colon).                           Normal mucosa was found in the entire colon                            otherwise.                           Non-bleeding non-thrombosed internal hemorrhoids                            were found during retroflexion, during perianal                            exam and during digital exam. The hemorrhoids were                            Grade I (internal hemorrhoids that do not prolapse). Complications:            No immediate complications. Estimated Blood Loss:     Estimated blood loss was minimal. Impression:               - Perianal skin tags found on perianal exam.                           - The examined portion of the ileum was normal.                            - Diverticulosis in the entire examined colon                            (greatest burden in left colon).                           - Normal mucosa in the entire examined colon                            otherwise.                           - Non-bleeding non-thrombosed internal hemorrhoids. Recommendation:           - The patient will be observed post-procedure,                            until all discharge criteria are met.                           -  Discharge patient to home.                           - Patient has a contact number available for                            emergencies. The signs and symptoms of potential                            delayed complications were discussed with the                            patient. Return to normal activities tomorrow.                            Written discharge instructions were provided to the                            patient.                           - High fiber diet.                           - Use FiberCon 1-2 tablets PO daily.                           - Continue present medications.                           - Repeat colonoscopy in 7 years for surveillance                            due to family history and personal history of colon                            polyps.                           - The findings and recommendations were discussed                            with the patient.                           - The findings and recommendations were discussed                            with the patient's family. Yong Henle, MD 05/27/2023 2:55:07 PM

## 2023-05-27 NOTE — Progress Notes (Signed)
 Report given to PACU, vss

## 2023-05-30 ENCOUNTER — Telehealth: Payer: Self-pay

## 2023-05-30 NOTE — Telephone Encounter (Signed)
  Follow up Call-     05/27/2023    1:28 PM  Call back number  Post procedure Call Back phone  # [phone]  Permission to leave phone message Yes     Patient questions:  Do you have a fever, pain , or abdominal swelling? No. Pain Score  0 *  Have you tolerated food without any problems? Yes.    Have you been able to return to your normal activities? Yes.    Do you have any questions about your discharge instructions: Diet   No. Medications  No. Follow up visit  No.  Do you have questions or concerns about your Care? No.  Actions: * If pain score is 4 or above: No action needed, pain <4.

## 2023-06-09 LAB — HM MAMMOGRAPHY

## 2023-06-25 ENCOUNTER — Telehealth: Admitting: Physician Assistant

## 2023-06-25 DIAGNOSIS — M542 Cervicalgia: Secondary | ICD-10-CM

## 2023-06-25 MED ORDER — CYCLOBENZAPRINE HCL 10 MG PO TABS: 10.0000 mg | ORAL_TABLET | Freq: Three times a day (TID) | ORAL | 0 refills | Status: DC | PRN

## 2023-06-25 MED ORDER — PREDNISONE 10 MG (21) PO TBPK: ORAL_TABLET | ORAL | 0 refills | Status: DC

## 2023-06-25 NOTE — Progress Notes (Signed)
 I have spent 5 minutes in review of e-visit questionnaire, review and updating patient chart, medical decision making and response to patient.   Piedad Climes, PA-C

## 2023-06-25 NOTE — Progress Notes (Signed)

## 2023-06-25 NOTE — Progress Notes (Signed)
 Patient seen earlier via e-visit. Tried to cancel this video but was unable to do so prior to appt time. Nothing further needed per patient. Appt canceled.

## 2023-06-30 ENCOUNTER — Encounter (HOSPITAL_BASED_OUTPATIENT_CLINIC_OR_DEPARTMENT_OTHER): Payer: Self-pay | Admitting: *Deleted

## 2023-06-30 ENCOUNTER — Encounter (HOSPITAL_BASED_OUTPATIENT_CLINIC_OR_DEPARTMENT_OTHER): Payer: Self-pay | Admitting: Family Medicine

## 2023-06-30 ENCOUNTER — Ambulatory Visit (HOSPITAL_BASED_OUTPATIENT_CLINIC_OR_DEPARTMENT_OTHER): Admitting: Family Medicine

## 2023-06-30 VITALS — BP 135/84 | HR 73 | Ht 64.0 in | Wt 165.4 lb

## 2023-06-30 DIAGNOSIS — K219 Gastro-esophageal reflux disease without esophagitis: Secondary | ICD-10-CM | POA: Insufficient documentation

## 2023-06-30 DIAGNOSIS — G4730 Sleep apnea, unspecified: Secondary | ICD-10-CM | POA: Insufficient documentation

## 2023-06-30 DIAGNOSIS — E663 Overweight: Secondary | ICD-10-CM | POA: Insufficient documentation

## 2023-06-30 DIAGNOSIS — J45909 Unspecified asthma, uncomplicated: Secondary | ICD-10-CM | POA: Insufficient documentation

## 2023-06-30 DIAGNOSIS — G43709 Chronic migraine without aura, not intractable, without status migrainosus: Secondary | ICD-10-CM | POA: Insufficient documentation

## 2023-06-30 DIAGNOSIS — F419 Anxiety disorder, unspecified: Secondary | ICD-10-CM | POA: Insufficient documentation

## 2023-06-30 MED ORDER — PHENTERMINE HCL 37.5 MG PO TABS: 37.5000 mg | ORAL_TABLET | Freq: Every day | ORAL | 2 refills | Status: DC

## 2023-06-30 NOTE — Progress Notes (Signed)
 New Patient Office Visit  Subjective:   Melinda Freeman [birth year] 06/30/2023  Chief Complaint  Patient presents with   New Patient (Initial Visit)    Patient is here today to get established with the practice. Recently went to UC due to neck issue and was prescribed meds to help with that. States she is currently going through menopause and states she has been gaining weight that she wants to discuss and also states she will wake up with bruises on her that she does not know how she got them.    HPI: Melinda Freeman presents today to establish care at [facility] at [facility]. Introduced to Publishing rights manager role and practice setting.  All questions answered.   Last PCP: [facility]  Last annual physical: Unknown Concerns: See below    WEIGHT MANAGEMENT: Melinda Freeman presents for weight management. She has a hx of chronic leukopenia and primary insomnia. She states she has noticed significant weight gain in the past several years with menopause. She is active walking several miles daily and follows a healthy diet.  Adhering to healthy diet: Yes- limits carbohydrates and has increased lean proteins. She drinks water throughout the day. She does use some pre-workout powder.  Regular exercise regimen: yes  Medications tried in the past: None  Wt Readings from Last 3 Encounters:  06/30/23 165 lb 6.4 oz (75 kg)  05/27/23 162 lb (73.5 kg)  04/05/23 162 lb (73.5 kg)    The following portions of the patient's history were reviewed and updated as appropriate: past medical history, past surgical history, family history, social history, allergies, medications, and problem list.   Patient Active Problem List   Diagnosis Date Noted   Chronic migraine without aura 06/30/2023   Overweight (BMI 25.0-29.9) 06/30/2023   Asthma    GERD (gastroesophageal reflux disease)    Sleep apnea    Anxiety    Leukopenia 08/12/2021   Constipation, chronic  06/20/2015   Past Medical History:  Diagnosis Date   Allergy    Anxiety    Asthma    adult onset   Chronic headaches    Chronic kidney disease    Constipation    GERD (gastroesophageal reflux disease)    History of kidney stones    Leukopenia    Sleep apnea    Past Surgical History:  Procedure Laterality Date   ABDOMINAL HYSTERECTOMY     CESAREAN SECTION     COLONOSCOPY     kidney stones     x 3, also had a stent   Family History  Problem Relation Age of Onset   Hypertension Mother    Cancer Mother    Hypertension Father    Colon cancer Maternal Aunt    Diabetes Maternal Aunt    Colon polyps Maternal Aunt        x 2   Colon polyps Cousin        x 2   Stomach cancer Neg Hx    Rectal cancer Neg Hx    Esophageal cancer Neg Hx    Social History   Socioeconomic History   Marital status: Single    Spouse name: Not on file   Number of children: 4   Years of education: Not on file   Highest education level: GED or equivalent  Occupational History   Occupation: customer service rep  Tobacco Use   Smoking status: Never   Smokeless tobacco: Never   Tobacco comments:  Never smoked  Vaping Use   Vaping status: Never Used  Substance and Sexual Activity   Alcohol use: Not Currently    Comment: wine once or twice a month   Drug use: No   Sexual activity: Yes    Birth control/protection: Surgical  Other Topics Concern   Not on file  Social History Narrative   Not on file   Social Drivers of Health   Financial Resource Strain: Low Risk  (06/29/2023)   Overall Financial Resource Strain (CARDIA)    Difficulty of Paying Living Expenses: Not very hard  Food Insecurity: No Food Insecurity (06/29/2023)   Hunger Vital Sign    Worried About Running Out of Food in the Last Year: Never true    Ran Out of Food in the Last Year: Never true  Transportation Needs: No Transportation Needs (06/29/2023)   PRAPARE - Administrator, Civil Service (Medical): No     Lack of Transportation (Non-Medical): No  Physical Activity: Insufficiently Active (06/29/2023)   Exercise Vital Sign    Days of Exercise per Week: 4 days    Minutes of Exercise per Session: 20 min  Stress: No Stress Concern Present (06/29/2023)   [employer] of Occupational Health - Occupational Stress Questionnaire    Feeling of Stress: Not at all  Social Connections: Moderately Integrated (06/29/2023)   Social Connection and Isolation Panel    Frequency of Communication with Friends and Family: More than three times a week    Frequency of Social Gatherings with Friends and Family: Never    Attends Religious Services: More than 4 times per year    Active Member of [employer] or Organizations: No    Attends Engineer, structural: Not on file    Marital Status: Living with partner  Intimate Partner Violence: Not on file   Outpatient Medications Prior to Visit  Medication Sig Dispense Refill   albuterol  (PROVENTIL  HFA;VENTOLIN  HFA) 108 (90 Base) MCG/ACT inhaler Inhale 1-2 puffs into the lungs every 6 (six) hours as needed for wheezing or shortness of breath. 1 Inhaler 0   aspirin-acetaminophen-caffeine (EXCEDRIN MIGRAINE) 250-250-65 MG per tablet Take 2 tablets by mouth every 6 (six) hours as needed. Headache     Atogepant (QULIPTA) 30 MG TABS Take 30 mg by mouth.     BACLOFEN  PO Take by mouth as needed (muscle spasms).     Collagen-Vitamin C-Biotin (COLLAGEN 1500/C PO) Take by mouth.     etodolac (LODINE XL) 600 MG 24 hr tablet Take 600 mg by mouth daily. As needed     famotidine  (PEPCID ) 20 MG tablet Take 20 mg by mouth 2 (two) times daily.     MAGNESIUM PO Take by mouth.     polyethylene glycol powder (GLYCOLAX /MIRALAX ) powder Take 17 g by mouth daily. 500 g 0   POTASSIUM PO Take by mouth.     traZODone (DESYREL) 50 MG tablet Take 100 mg by mouth at bedtime.     vitamin B-12 (CYANOCOBALAMIN) 1000 MCG tablet Take 1,000 mcg by mouth daily. (Patient taking differently: Take 1,000  mcg by mouth daily. Patient takes as needed)     vitamin C (ASCORBIC ACID) 500 MG tablet Take 500 mg by mouth daily.     VITAMIN D PO Take 1,000 mg by mouth.     cyclobenzaprine  (FLEXERIL ) 10 MG tablet Take 1 tablet (10 mg total) by mouth 3 (three) times daily as needed for muscle spasms. 30 tablet 0   predniSONE  (STERAPRED UNI-PAK  21 TAB) 10 MG (21) TBPK tablet Take following package directions 21 tablet 0   No facility-administered medications prior to visit.   Allergies  Allergen Reactions   Codeine Other (See Comments)    Fainting    Hydrocodone Swelling   Percocet [Oxycodone-Acetaminophen] Swelling   Flexeril  [Cyclobenzaprine ] Other (See Comments)    Problems with nausea and also made her extremely drowsy.    ROS: A complete ROS was performed with pertinent positives/negatives noted in the HPI. The remainder of the ROS are negative.   Objective:   Today's Vitals   06/30/23 0926  BP: 135/84  Pulse: 73  SpO2: 100%  Weight: 165 lb 6.4 oz (75 kg)  Height: 5' 4 (1.626 m)    GENERAL: Well-appearing, in NAD. Well nourished.  SKIN: Pink, warm and dry.  Head: Normocephalic. NECK: Trachea midline. Full ROM w/o pain or tenderness.  RESPIRATORY: Chest wall symmetrical. Respirations even and non-labored. Breath sounds clear to auscultation bilaterally.  CARDIAC: S1, S2 present, regular rate and rhythm without murmur or gallops. Peripheral pulses 2+ bilaterally.  MSK: Muscle tone and strength appropriate for age.  NEUROLOGIC: No motor or sensory deficits. Steady, even gait. C2-C12 intact.  PSYCH/MENTAL STATUS: Alert, oriented x 3. Cooperative, appropriate mood and affect.   EKG (06/30/2023) : normal EKG, normal sinus rhythm, there are no previous tracings available for comparison.     Assessment & Plan:  1. Overweight (BMI 25.0-29.9) (Primary) Discussed metabolic changes as we age with patient and hormonal intervention. Recommend she establish with [facility] for dietary counseling. She  was also interested in starting Phentermine x 3 months and verbalized understanding of risk. PDMP reviewed and EKG unremarkable. Will follow up in 3 months or sooner if needed for AE. Will obtain records from prior PCP with [facility].  - Amb Ref to Medical Weight Management - phentermine (ADIPEX-P) 37.5 MG tablet; Take 1 tablet (37.5 mg total) by mouth daily before breakfast.  Dispense: 30 tablet; Refill: 2    Patient to reach out to office if new, worrisome, or unresolved symptoms arise or if no improvement in patient's condition. Patient verbalized understanding and is agreeable to treatment plan. All questions answered to patient's satisfaction.    Return in about 3 months (around 09/30/2023) for ANNUAL PHYSICAL (fasting labs at visit) .    Nonda Bays, [place]

## 2023-07-01 ENCOUNTER — Ambulatory Visit (HOSPITAL_BASED_OUTPATIENT_CLINIC_OR_DEPARTMENT_OTHER): Admitting: Family Medicine

## 2023-07-27 ENCOUNTER — Other Ambulatory Visit (HOSPITAL_BASED_OUTPATIENT_CLINIC_OR_DEPARTMENT_OTHER): Payer: Self-pay | Admitting: Family Medicine

## 2023-07-27 DIAGNOSIS — E663 Overweight: Secondary | ICD-10-CM

## 2023-07-27 MED ORDER — TRAZODONE HCL 100 MG PO TABS: 100.0000 mg | ORAL_TABLET | Freq: Every day | ORAL | 5 refills | Status: DC

## 2023-07-27 NOTE — Telephone Encounter (Signed)
 Copied from CRM [id number]. Topic: Clinical - Medication Refill >> Jul 27, 2023  8:03 AM Antwanette L wrote: Medication: traZODone  (DESYREL ) 50 MG tablet   And phentermine  (ADIPEX-P ) 37.5 MG tablet Has the patient contacted their pharmacy? Yes   This is the patient's preferred pharmacy:  [id number] [place], [place] - [address] [place] [address] Phone: [phone] Fax: [phone]  Is this the correct pharmacy for this prescription? Yes .   Has the prescription been filled recently? Yes  Is the patient out of the medication? No. Patient has one trazodone  pill and 2 phentermine   Has the patient been seen for an appointment in the last year OR does the patient have an upcoming appointment? Yes. Last ov with Thersia Stark was on 06/30/23  Can we respond through MyChart? Yes  Agent: Please be advised that Rx refills may take up to 3 business days. We ask that you follow-up with your pharmacy.

## 2023-07-27 NOTE — Telephone Encounter (Signed)
 06/30/23 30 tabs/2 RF

## 2023-09-28 ENCOUNTER — Ambulatory Visit (HOSPITAL_BASED_OUTPATIENT_CLINIC_OR_DEPARTMENT_OTHER): Admitting: Family Medicine

## 2023-09-28 ENCOUNTER — Encounter (HOSPITAL_BASED_OUTPATIENT_CLINIC_OR_DEPARTMENT_OTHER): Payer: Self-pay | Admitting: Family Medicine

## 2023-09-28 VITALS — BP 129/82 | HR 85 | Ht 64.0 in | Wt 152.6 lb

## 2023-09-28 DIAGNOSIS — D1801 Hemangioma of skin and subcutaneous tissue: Secondary | ICD-10-CM | POA: Diagnosis not present

## 2023-09-28 DIAGNOSIS — R233 Spontaneous ecchymoses: Secondary | ICD-10-CM | POA: Diagnosis not present

## 2023-09-28 DIAGNOSIS — D72819 Decreased white blood cell count, unspecified: Secondary | ICD-10-CM | POA: Diagnosis not present

## 2023-09-28 NOTE — Patient Instructions (Signed)
  Medication Instructions:  Your physician recommends that you continue on your current medications as directed. Please refer to the Current Medication list given to you today. --If you need a refill on any your medications before your next appointment, please call your pharmacy first. If no refills are authorized on file call the office.-- Lab Work: Your physician has recommended that you have lab work today: today If you have labs (blood work) drawn today and your tests are completely normal, you will receive your results via MyChart message OR a phone call from our staff.  Please ensure you check your voicemail in the event that you authorized detailed messages to be left on a delegated number. If you have any lab test that is abnormal or we need to change your treatment, we will call you to review the results.   Follow-Up: Your next appointment:   Your physician recommends that you schedule a follow-up appointment in: keep appt with Thersia Lanius will receive a text message or e-mail with a link to a survey about your care and experience with us  today! We would greatly appreciate your feedback!   Thanks for letting us  be apart of your health journey!!  Primary Care and [facility] Medicine   Dr. Quintin sheerer [place]   We encourage you to activate your patient portal called MyChart.  Sign up information is provided on this After Visit Summary.  MyChart is used to connect with patients for Virtual Visits (Telemedicine).  Patients are able to view lab/test results, encounter notes, upcoming appointments, etc.  Non-urgent messages can be sent to your provider as well. To learn more about what you can do with MyChart, please visit --  [email or web address].

## 2023-09-28 NOTE — Assessment & Plan Note (Signed)
 Advised that lesions noted over the body are most likely related to cherry angiomas.  These are benign lesions and do not necessarily require treatment.  Can monitor over time for any specific changes

## 2023-09-28 NOTE — Progress Notes (Addendum)
 Procedures performed today:    None.  Independent interpretation of notes and tests performed by another provider:   None.  Brief History, Exam, Impression, and Recommendations:    BP 129/82 (BP Location: Left Arm, Patient Position: Sitting, Cuff Size: Normal)   Pulse 85   Ht 5' 4 (1.626 m)   Wt 152 lb 9.6 oz (69.2 kg)   SpO2 97%   BMI 26.19 kg/m   Patient presents today for evaluation of bruising.  She was scheduled as an acute visit by call center due to a lot of new bruising.  On discussion, this is something that has been going on for some time for patient.  This was discussed with her PCP a few months ago.  She was scheduled for physical to be completed around this time, however it appears that she did cancel this appointment and rescheduled for a few months from now prior to scheduling this acute visit.  Plan was also to have labs completed in conjunction with physical appointment.  Discussed the use of AI scribe software for clinical note transcription with the patient, who gave verbal consent to proceed.  History of Present Illness Melinda Freeman is a 53 year old female who presents with unexplained bruising and skin changes.  She experiences daily unexplained bruising, which has been worsening over time. Initially, she noticed a bruise at work without any recollection of injury. The bruises have been present for several months, appearing on various parts of her body including her arms, legs, back, and lower extremities. They are painful and often appear before the previous ones have resolved. She has been keeping a record of the bruises, noting that they 'pop up everywhere' and are not limited to a specific area.  Additionally, she has noticed the appearance of small red dots on her skin, which she describes as itchy. Her past medical history includes a previous diagnosis of leukopenia, and she has since discontinued gabapentin. No family history of bleeding disorders. No  history of recurrent epistaxis or difficulty stopping epistaxis.  On exam, patient is in no acute distress, vital signs stable.  Cardiovascular exam with regular rate and rhythm, lungs clear to auscultation bilaterally.  She does have bruises in various stages of healing, primarily noted over extremities.  Additionally does have pinpoint petechia noted over various parts of body.  Easy bruising Assessment & Plan: Bruising on extremities and back, painful and spontaneous. No family history of bleeding disorders. - Order blood work to check platelet count and clotting factors. - Review blood work results to determine next steps.  Orders: -     CBC with Differential/Platelet -     Comprehensive metabolic panel with GFR -     PT and PTT  Leukopenia, unspecified type Assessment & Plan: Leukopenia noted in past labs, previously evaluated by hematologist. Possible association with medications in the past, now discontinued. Reassessment needed. - Include white blood cell count in blood work to reassess leukopenia.  Orders: -     CBC with Differential/Platelet  Cherry angioma Assessment & Plan: Advised that lesions noted over the body are most likely related to cherry angiomas.  These are benign lesions and do not necessarily require treatment.  Can monitor over time for any specific changes   Return if symptoms worsen or fail to improve.  Given medication being utilized, patient does need appointment sooner than 3 months from now.  Advised on following up with her PCP within the next 2 to 4 weeks for  monitoring on current medication.   ___________________________________________ Javarious Elsayed de [place], MD, ABFM, CAQSM Primary Care and Sports Medicine [facility]

## 2023-09-28 NOTE — Assessment & Plan Note (Addendum)
 Bruising on extremities and back, painful and spontaneous. No family history of bleeding disorders. - Order blood work to check platelet count and clotting factors. - Review blood work results to determine next steps.

## 2023-09-28 NOTE — Assessment & Plan Note (Signed)
 Leukopenia noted in past labs, previously evaluated by hematologist. Possible association with medications in the past, now discontinued. Reassessment needed. - Include white blood cell count in blood work to reassess leukopenia.

## 2023-09-29 ENCOUNTER — Ambulatory Visit (HOSPITAL_BASED_OUTPATIENT_CLINIC_OR_DEPARTMENT_OTHER): Payer: Self-pay | Admitting: Family Medicine

## 2023-09-29 LAB — CBC WITH DIFFERENTIAL/PLATELET
Basophils Absolute: 0.1 x10E3/uL (ref 0.0–0.2)
Basos: 2 %
EOS (ABSOLUTE): 0.1 x10E3/uL (ref 0.0–0.4)
Eos: 4 %
Hematocrit: 40.1 % (ref 34.0–46.6)
Hemoglobin: 12.9 g/dL (ref 11.1–15.9)
Immature Grans (Abs): 0 x10E3/uL (ref 0.0–0.1)
Immature Granulocytes: 0 %
Lymphocytes Absolute: 1.2 x10E3/uL (ref 0.7–3.1)
Lymphs: 41 %
MCH: 30.8 pg (ref 26.6–33.0)
MCHC: 32.2 g/dL (ref 31.5–35.7)
MCV: 96 fL (ref 79–97)
Monocytes Absolute: 0.3 x10E3/uL (ref 0.1–0.9)
Monocytes: 11 %
Neutrophils Absolute: 1.2 x10E3/uL — ABNORMAL LOW (ref 1.4–7.0)
Neutrophils: 42 %
Platelets: 251 x10E3/uL (ref 150–450)
RBC: 4.19 x10E6/uL (ref 3.77–5.28)
RDW: 12.5 % (ref 11.7–15.4)
WBC: 2.9 x10E3/uL — ABNORMAL LOW (ref 3.4–10.8)

## 2023-09-29 LAB — COMPREHENSIVE METABOLIC PANEL WITH GFR
ALT: 19 IU/L (ref 0–32)
AST: 21 IU/L (ref 0–40)
Albumin: 4.6 g/dL (ref 3.8–4.9)
Alkaline Phosphatase: 54 IU/L (ref 44–121)
BUN/Creatinine Ratio: 10 (ref 9–23)
BUN: 11 mg/dL (ref 6–24)
Bilirubin Total: 0.3 mg/dL (ref 0.0–1.2)
CO2: 23 mmol/L (ref 20–29)
Calcium: 9.8 mg/dL (ref 8.7–10.2)
Chloride: 100 mmol/L (ref 96–106)
Creatinine, Ser: 1.07 mg/dL — ABNORMAL HIGH (ref 0.57–1.00)
Globulin, Total: 2.9 g/dL (ref 1.5–4.5)
Glucose: 91 mg/dL (ref 70–99)
Potassium: 4.3 mmol/L (ref 3.5–5.2)
Sodium: 137 mmol/L (ref 134–144)
Total Protein: 7.5 g/dL (ref 6.0–8.5)
eGFR: 63 mL/min/1.73 (ref >59)

## 2023-09-29 LAB — PT AND PTT
INR: 0.9 (ref 0.9–1.2)
Prothrombin Time: 10.3 s (ref 9.1–12.0)
aPTT: 27 s (ref 24–33)

## 2023-10-04 ENCOUNTER — Encounter (HOSPITAL_BASED_OUTPATIENT_CLINIC_OR_DEPARTMENT_OTHER): Payer: Self-pay | Admitting: Family Medicine

## 2023-10-05 ENCOUNTER — Encounter (HOSPITAL_BASED_OUTPATIENT_CLINIC_OR_DEPARTMENT_OTHER): Admitting: Family Medicine

## 2023-10-05 NOTE — Telephone Encounter (Signed)
 Please see message sent by pt as an FYI.

## 2023-10-11 ENCOUNTER — Other Ambulatory Visit (HOSPITAL_BASED_OUTPATIENT_CLINIC_OR_DEPARTMENT_OTHER): Payer: Self-pay | Admitting: Family Medicine

## 2023-10-11 DIAGNOSIS — R7989 Other specified abnormal findings of blood chemistry: Secondary | ICD-10-CM

## 2023-10-12 LAB — CBC WITH DIFFERENTIAL/PLATELET
Basophils Absolute: 0.1 x10E3/uL (ref 0.0–0.2)
Basos: 3 %
EOS (ABSOLUTE): 0.1 x10E3/uL (ref 0.0–0.4)
Eos: 4 %
Hematocrit: 37.2 % (ref 34.0–46.6)
Hemoglobin: 12.2 g/dL (ref 11.1–15.9)
Immature Grans (Abs): 0 x10E3/uL (ref 0.0–0.1)
Immature Granulocytes: 0 %
Lymphocytes Absolute: 1.1 x10E3/uL (ref 0.7–3.1)
Lymphs: 41 %
MCH: 31.1 pg (ref 26.6–33.0)
MCHC: 32.8 g/dL (ref 31.5–35.7)
MCV: 95 fL (ref 79–97)
Monocytes Absolute: 0.3 x10E3/uL (ref 0.1–0.9)
Monocytes: 12 %
Neutrophils Absolute: 1.1 x10E3/uL — ABNORMAL LOW (ref 1.4–7.0)
Neutrophils: 40 %
Platelets: 249 x10E3/uL (ref 150–450)
RBC: 3.92 x10E6/uL (ref 3.77–5.28)
RDW: 12.8 % (ref 11.7–15.4)
WBC: 2.7 x10E3/uL — ABNORMAL LOW (ref 3.4–10.8)

## 2023-10-12 LAB — SEDIMENTATION RATE: Sed Rate: 8 mm/h (ref 0–40)

## 2023-10-12 LAB — B12 AND FOLATE PANEL
Folate: 10.1 ng/mL (ref >3.0)
Vitamin B-12: 1760 pg/mL — ABNORMAL HIGH (ref 232–1245)

## 2023-10-13 ENCOUNTER — Ambulatory Visit (HOSPITAL_BASED_OUTPATIENT_CLINIC_OR_DEPARTMENT_OTHER): Payer: Self-pay | Admitting: Family Medicine

## 2023-10-13 ENCOUNTER — Other Ambulatory Visit (HOSPITAL_BASED_OUTPATIENT_CLINIC_OR_DEPARTMENT_OTHER): Payer: Self-pay | Admitting: *Deleted

## 2023-10-13 DIAGNOSIS — D72819 Decreased white blood cell count, unspecified: Secondary | ICD-10-CM

## 2023-10-13 DIAGNOSIS — R233 Spontaneous ecchymoses: Secondary | ICD-10-CM

## 2023-10-13 DIAGNOSIS — D709 Neutropenia, unspecified: Secondary | ICD-10-CM

## 2023-10-13 NOTE — Telephone Encounter (Signed)
 Called [facility] to add on hep c and hiv [facility] will fax over paper to sign and will notify us  if unable to add on testing

## 2023-10-13 NOTE — Progress Notes (Signed)
 Hi Melinda Freeman,  Your B12 level is stable as well as folate.  This does not show any issues with absorption that could be contributing to your neutropenia.  Your white blood cell count and your neutrophil count have continued to decline.  At this time further testing such as HIV and hepatitis C if there are any concerns would be our next step as well as placing referral for hematology for further evaluation.  I will go ahead and place this referral so they can reach out and get you scheduled.  If you would like to obtain the HIV and hepatitis C screenings admits it has been about 2 years, I can attempt to add these onto your labs or we will have you come back in for lab work if needed.  Please let me know if you are agreeable to this and we will add these on.

## 2023-10-21 ENCOUNTER — Other Ambulatory Visit (HOSPITAL_BASED_OUTPATIENT_CLINIC_OR_DEPARTMENT_OTHER): Payer: Self-pay | Admitting: Family Medicine

## 2023-10-21 DIAGNOSIS — E663 Overweight: Secondary | ICD-10-CM

## 2023-11-04 LAB — HIV ANTIBODY (ROUTINE TESTING W REFLEX): HIV Screen 4th Generation wRfx: NONREACTIVE

## 2023-11-04 LAB — SPECIMEN STATUS REPORT

## 2023-11-04 LAB — HEPATITIS C ANTIBODY: Hep C Virus Ab: NONREACTIVE

## 2023-11-15 ENCOUNTER — Encounter (HOSPITAL_BASED_OUTPATIENT_CLINIC_OR_DEPARTMENT_OTHER): Payer: Self-pay | Admitting: Family Medicine

## 2023-11-19 ENCOUNTER — Telehealth: Admitting: Family Medicine

## 2023-11-19 DIAGNOSIS — M549 Dorsalgia, unspecified: Secondary | ICD-10-CM | POA: Diagnosis not present

## 2023-11-19 MED ORDER — PREDNISONE 20 MG PO TABS: 20.0000 mg | ORAL_TABLET | Freq: Two times a day (BID) | ORAL | 0 refills | Status: AC

## 2023-11-19 MED ORDER — BACLOFEN 10 MG PO TABS
10.0000 mg | ORAL_TABLET | Freq: Three times a day (TID) | ORAL | 0 refills | Status: DC
Start: 2023-11-19 — End: 2023-11-19

## 2023-11-19 NOTE — Addendum Note (Signed)
 Addended by: ALMEDA DEGREE on: 11/19/2023 11:20 AM   Modules accepted: Orders

## 2023-11-19 NOTE — Progress Notes (Signed)
 We are sorry that you are not feeling well.  Here is how we plan to help!  Based on what you have shared with me it looks like you mostly have acute back pain.  Acute back pain is defined as musculoskeletal pain that can resolve in 1-3 weeks with conservative treatment.  I have prescribed Baclofen  10 mg every eight hours as needed which is a muscle relaxer  Some patients experience stomach irritation or in increased heartburn with anti-inflammatory drugs.  Please keep in mind that muscle relaxer's can cause fatigue and should not be taken while at work or driving.  Back pain is very common.  The pain often gets better over time.  The cause of back pain is usually not dangerous.  Most people can learn to manage their back pain on their own.  Home Care Stay active.  Start with short walks on flat ground if you can.  Try to walk farther each day. Do not sit, drive or stand in one place for more than 30 minutes.  Do not stay in bed. Do not avoid exercise or work.  Activity can help your back heal faster. Be careful when you bend or lift an object.  Bend at your knees, keep the object close to you, and do not twist. Sleep on a firm mattress.  Lie on your side, and bend your knees.  If you lie on your back, put a pillow under your knees. Only take medicines as told by your doctor. Put ice on the injured area. Put ice in a plastic bag Place a towel between your skin and the bag Leave the ice on for 15-20 minutes, 3-4 times a day for the first 2-3 days. 210 After that, you can switch between ice and heat packs. Ask your doctor about back exercises or massage. Avoid feeling anxious or stressed.  Find good ways to deal with stress, such as exercise.  Get Help Right Way If: Your pain does not go away with rest or medicine. Your pain does not go away in 1 week. You have new problems. You do not feel well. The pain spreads into your legs. You cannot control when you poop (bowel movement) or pee  (urinate) You feel sick to your stomach (nauseous) or throw up (vomit) You have belly (abdominal) pain. You feel like you may pass out (faint). If you develop a fever.  Make Sure you: Understand these instructions. Will watch your condition Will get help right away if you are not doing well or get worse.  Your e-visit answers were reviewed by a board certified advanced clinical practitioner to complete your personal care plan.  Depending on the condition, your plan could have included both over the counter or prescription medications.  If there is a problem please reply  once you have received a response from your provider.  Your safety is important to us .  If you have drug allergies check your prescription carefully.    You can use MyChart to ask questions about today's visit, request a non-urgent call back, or ask for a work or school excuse for 24 hours related to this e-Visit. If it has been greater than 24 hours you will need to follow up with your provider, or enter a new e-Visit to address those concerns.  You will get an e-mail in the next two days asking about your experience.  I hope that your e-visit has been valuable and will speed your recovery. Thank you for using e-visits.  I have spent 5 minutes in review of e-visit questionnaire, review and updating patient chart, medical decision making and response to patient.   Ahmad Vanwey, FNP

## 2023-11-23 NOTE — Progress Notes (Unsigned)
 [facility] [phone]   [phone]  CONSULT NOTE  REFERRING PHYSICIAN: Thersia Stark NP  REASON FOR CONSULTATION:  Neutropenia  HPI Melinda Freeman is a 53 y.o. female with a past medical history significant for migraine, asthma, sleep apnea, sickle cell trait, history of vitamin B-12 deficiency, GERD, and reaction to COVID-19 causing chronic muscle spasms/cervical radiculopathy is referred to the clinic for leukopenia/neutropenia.   Of note, the patient was seen for leukopenia and neutropenia in July 2023 by Dr. Sherrod and myself. She had folate testing, vitamin B12, RF, ANA, HIV, hepatitis, CMP, and CBC, and LDH. Labs and follow up in 3 months was recommended. It was felt her neutropenia would be medication or drug induced. Her WBC was 3.8 and ANC 1.9 at that time.   The patient was re-referred to the clinic recently. The patient establish care with a new PCP in June 30, 2023. At that time she was endorsing unexplained bruising.  She was reporting bruising in various parts of her body including arms, legs, back, and lower extremities. She had PT and PTT drawn which was normal.   She had blood work performed on 09/28/23 which included CBC showing WBC 2.9, ANC 1.2, normal platelet count 251k, and normal Hbg 12.9.   Her CMP was unremarkable. Her B12 was elevated at 1,760 and her folate was normal at 10.1. She stopped taking her B12 due to elevated B12 levels.   Her CBC was repeated on 9/23 showing persistent leukopenia/neutropenia with WBC of 2.7 and ANC of 1.1. Sed rate was normal at 8. HIV testing negative. Hepatitis C negative.   Her WBC was normal up until about 2022. The patient was able to pull up records from [facility] in which her WBC was 2.9.   The patient's main medical problem is related to chronic pain in the left shoulder and neck area which occurred after receiving the COVID-19 vaccine in 2021 which caused muscle spasms and cervical  radiculopathy.  She takes baclofen  for this.   Medications that she takes include trazodone , baclofen  as needed for muscle spasms, Excedrin every now and then as needed for headaches (a few times per week), reflux medication as needed, vitamin C, and magnesium.  She does not take herbal supplements. She did recently take a short course of prednisone  for her radiculopathy.   The patient does not have any major concerns today except related to the occasional muscle spasms.  She states that her energy is good and she walks on a regular basis.  She tries to take care of herself and eats a well-balanced diet including a lot of vegetables and smoothies.  She denies any history of bariatric surgery or dietary restrictions.  She denies any personal history of autoimmune disorders.  Denies any unusual rashes. She denies tickborne illness. Denies HIV, hepatitis, or mono.   She still gets intermittent bruising, although she states it is improved compared to prior when she saw her PCP in June.   She denies frequent infections.  She rarely gets sick. She denies any unusual bloating or early satiety. She experiences nausea which she has had majority of her life and denies changes in frequency or severity.   Denies any history of hepatitis, mono, bacterial, or parasitic infections.  She denies any fever, chills, or unexplained weight loss. She experiences night sweats due to being perimenopausal.   Denies any lymphadenopathy except she had a benign mass resected from her right axilla/breast a few years ago  in 2022.   Denies any history of liver disease. Denies any gastrointestinal disease such as inflammatory bowel disease.    She denies abnormal bleeding. She is up to date on her health screenings. She had colonoscopy a few months ago which was normal except for some internal hemorrhoids.   She denies any significant alcohol use or history of heavy alcohol use.  She estimates she has an alcoholic beverage about  1x per month.   The patient's mother had lung cancer.  The patient's father had migraines and hypertension.  The patient's brother has migraines.  She denies any family history of any hematologic disorders.  The patient works as an engineer, building services at  [employer].  She is divorced but is currently engaged.  She denies any smoking, alcohol, or drug use.  She has 4 children who are healthy.   HPI  Past Medical History:  Diagnosis Date   Allergy    Anxiety    Asthma    adult onset   Chronic headaches    Chronic kidney disease    Constipation    GERD (gastroesophageal reflux disease)    History of kidney stones    Leukopenia    Sleep apnea     Past Surgical History:  Procedure Laterality Date   ABDOMINAL HYSTERECTOMY     CESAREAN SECTION     COLONOSCOPY     kidney stones     x 3, also had a stent    Family History  Problem Relation Age of Onset   Hypertension Mother    Cancer Mother    Hypertension Father    Colon cancer Maternal Aunt    Diabetes Maternal Aunt    Colon polyps Maternal Aunt        x 2   Colon polyps Cousin        x 2   Stomach cancer Neg Hx    Rectal cancer Neg Hx    Esophageal cancer Neg Hx     Social History Social History   Tobacco Use   Smoking status: Never   Smokeless tobacco: Never   Tobacco comments:    Never smoked  Vaping Use   Vaping status: Never Used  Substance Use Topics   Alcohol use: Not Currently    Comment: wine once or twice a month   Drug use: No    Allergies  Allergen Reactions   Codeine Other (See Comments)    Fainting    Hydrocodone Swelling   Percocet [Oxycodone-Acetaminophen] Swelling   Flexeril  [Cyclobenzaprine ] Other (See Comments)    Problems with nausea and also made her extremely drowsy.    Current Outpatient Medications  Medication Sig Dispense Refill   albuterol  (PROVENTIL  HFA;VENTOLIN  HFA) 108 (90 Base) MCG/ACT inhaler Inhale 1-2 puffs into the lungs every 6 (six) hours as  needed for wheezing or shortness of breath. 1 Inhaler 0   aspirin-acetaminophen-caffeine (EXCEDRIN MIGRAINE) 250-250-65 MG per tablet Take 2 tablets by mouth every 6 (six) hours as needed. Headache     BACLOFEN  PO Take by mouth as needed (muscle spasms).     Collagen-Vitamin C-Biotin (COLLAGEN 1500/C PO) Take by mouth.     famotidine  (PEPCID ) 20 MG tablet Take 20 mg by mouth 2 (two) times daily.     MAGNESIUM PO Take by mouth.     phentermine  (ADIPEX-P ) 37.5 MG tablet Take 1 tablet (37.5 mg total) by mouth daily before breakfast. 30 tablet 2   polyethylene glycol powder (GLYCOLAX /MIRALAX ) powder Take  17 g by mouth daily. 500 g 0   POTASSIUM PO Take by mouth.     traZODone  (DESYREL ) 100 MG tablet Take 1 tablet (100 mg total) by mouth at bedtime. 30 tablet 5   vitamin C (ASCORBIC ACID) 500 MG tablet Take 500 mg by mouth daily.     VITAMIN D PO Take 1,000 mg by mouth.     No current facility-administered medications for this visit.    REVIEW OF SYSTEMS:   Review of Systems  Constitutional: Negative for appetite change, chills, fatigue, fever and unexpected weight change.  HENT: Negative for mouth sores, nosebleeds, sore throat and trouble swallowing.   Eyes: Negative for eye problems and icterus.  Respiratory: Negative for cough, hemoptysis, shortness of breath and wheezing.   Cardiovascular: Negative for chest pain and leg swelling.  Gastrointestinal: Negative for abdominal pain, constipation, diarrhea, nausea and vomiting.  Genitourinary: Negative for bladder incontinence, difficulty urinating, dysuria, frequency and hematuria.   Musculoskeletal: Positive for left shoulder pain and muscle spasms. Negative for  gait problem, neck pain and neck stiffness.  Skin: Negative for itching and rash.  Neurological: Negative for dizziness, extremity weakness, gait problem, headaches, light-headedness and seizures.  Hematological: Negative for adenopathy. Does not bruise/bleed easily.   Psychiatric/Behavioral: Negative for confusion, depression and sleep disturbance. The patient is not nervous/anxious.     PHYSICAL EXAMINATION:  Blood pressure (!) 118/91, pulse 65, temperature (!) 97.2 F (36.2 C), temperature source Temporal, resp. rate 16, height 5' 4 (1.626 m), weight 158 lb (71.7 kg), SpO2 100%.  ECOG PERFORMANCE STATUS: 0  Physical Exam  Constitutional: Oriented to person, place, and time and well-developed, well-nourished, and in no distress.  HENT:  Head: Normocephalic and atraumatic.  Mouth/Throat: Oropharynx is clear and moist. No oropharyngeal exudate.  Eyes: Conjunctivae are normal. Right eye exhibits no discharge. Left eye exhibits no discharge. No scleral icterus.  Neck: Normal range of motion. Neck supple.  Cardiovascular: Normal rate, regular rhythm, normal heart sounds and intact distal pulses.   Pulmonary/Chest: Effort normal and breath sounds normal. No respiratory distress. No wheezes. No rales.  Abdominal: Soft. Bowel sounds are normal. Exhibits no distension and no mass. There is no tenderness.  Musculoskeletal: Normal range of motion. Exhibits no edema.  Lymphadenopathy:    No cervical adenopathy.  Neurological: Alert and oriented to person, place, and time. Exhibits normal muscle tone. Gait normal. Coordination normal.  Skin: Skin is warm and dry. No rash noted. Not diaphoretic. No erythema. No pallor.  Psychiatric: Mood, memory and judgment normal.  Vitals reviewed.  LABORATORY DATA: Lab Results  Component Value Date   WBC 5.2 11/25/2023   HGB 12.7 11/25/2023   HCT 37.4 11/25/2023   MCV 92.3 11/25/2023   PLT 266 11/25/2023      Chemistry      Component Value Date/Time   NA 138 11/25/2023 0944   NA 137 09/28/2023 0917   K 4.1 11/25/2023 0944   CL 102 11/25/2023 0944   CO2 31 11/25/2023 0944   BUN 20 11/25/2023 0944   BUN 11 09/28/2023 0917   CREATININE 1.13 (H) 11/25/2023 0944   CREATININE 0.85 02/19/2013 1732       Component Value Date/Time   CALCIUM 9.4 11/25/2023 0944   ALKPHOS 43 11/25/2023 0944   AST 20 11/25/2023 0944   ALT 29 11/25/2023 0944   BILITOT 0.4 11/25/2023 0944       RADIOGRAPHIC STUDIES: No results found.  ASSESSMENT: This very pleasant 53 year old African-American female  referred to the clinic for leukopenia/neutropenia   The patient was seen with Dr. Federico today.    After review of the labs, review of the records, and discussion with the patient the patients findings are most consistent with leukopenia of unclear etiology. Possibly related to inflammation due to her chronic shoulder pain or drug induced.    The differential for neutropenia includes nutritional deficiency, inflammatory disorder, medication side effect, infectious etiology, or congenital condition (such as benign ethnic neutropenia). The patient is not taking any medications known to cause neutropenia. Workup for this condition includes viral serologies (HIV, Hep B and Hep C), vitamin b12/folate, and inflammatory markers with ESR and CRP.       #Leukopenia  --The patient was seen today in consultation with Dr. Federico. --Evaluation will include:  --repeat CBC and CMP today and WBC by citrate.  --infectious serology testing with Hep B. She was already tested for Hep C and HIV by her PCP --We will repeat her B12 and folate testing. Will order MMA.  --Inflammatory markers: CRP. She had ESR drawn a few weeks ago.  --Per Dr. Federico, no bone marrow biopsy is indicated at this time unless significant neutropenia.  -- If her findings are unremarkable, then we will monitor her with labs and follow up in 6 months.   The patient voices understanding of current disease status and treatment options and is in agreement with the current care plan.  All questions were answered. The patient knows to call the clinic with any problems, questions or concerns. We can certainly see the patient much sooner if necessary.  Thank  you so much for allowing me to participate in the care of Melinda Freeman. I will continue to follow up the patient with you and assist in her care.  Disclaimer: This note was dictated with voice recognition software. Similar sounding words can inadvertently be transcribed and may not be corrected upon review.   Norleen ONEIDA Federico IV November 28, 2023, 1:34 PM   I have read the above note and personally examined the patient. I agree with the assessment and plan as noted above.  Briefly Mrs. Melinda Freeman is a 53 year old female who presents for evaluation of leukopenia/neutropenia.  Review of records show that the patient virtually always has low to low normal white blood cell count.  On 08/12/2021 patient had a white blood cell count of 3.8 with an ANC of 1.9.  More recently on 10/11/2023 patient a white blood cell count of 2.7 with neutrophil count of 1.1.  Due to concern for these findings the patient was referred to hematology for further evaluation and management.  Possible etiologies for the patient's leukopenia include nutritional deficiency, viral etiology, inflammation, or liver disease.  Today we will order full nutritional workup to include vitamin B12, additionally will order inflammatory markers.  In the event that no clear etiology can be found and her ANC is greater than 1.0 would recommend routine follow-up in 6 months time.  Patient voiced understanding of our findings and plan moving forward.   Norleen IVAR Federico, MD Department of Hematology/Oncology [facility] at [facility] Phone: [phone] Pager: [phone] Email: [email or web address]

## 2023-11-24 ENCOUNTER — Other Ambulatory Visit: Payer: Self-pay | Admitting: Physician Assistant

## 2023-11-24 ENCOUNTER — Other Ambulatory Visit: Payer: Self-pay | Admitting: *Deleted

## 2023-11-24 DIAGNOSIS — D72819 Decreased white blood cell count, unspecified: Secondary | ICD-10-CM

## 2023-11-25 ENCOUNTER — Inpatient Hospital Stay: Payer: Self-pay

## 2023-11-25 ENCOUNTER — Inpatient Hospital Stay

## 2023-11-25 ENCOUNTER — Inpatient Hospital Stay: Payer: Self-pay | Attending: Physician Assistant | Admitting: Physician Assistant

## 2023-11-25 VITALS — BP 118/91 | HR 65 | Temp 97.2°F | Resp 16 | Ht 64.0 in | Wt 158.0 lb

## 2023-11-25 DIAGNOSIS — K648 Other hemorrhoids: Secondary | ICD-10-CM | POA: Diagnosis not present

## 2023-11-25 DIAGNOSIS — Z79899 Other long term (current) drug therapy: Secondary | ICD-10-CM | POA: Diagnosis not present

## 2023-11-25 DIAGNOSIS — S20229A Contusion of unspecified back wall of thorax, initial encounter: Secondary | ICD-10-CM | POA: Diagnosis not present

## 2023-11-25 DIAGNOSIS — F419 Anxiety disorder, unspecified: Secondary | ICD-10-CM | POA: Diagnosis not present

## 2023-11-25 DIAGNOSIS — M542 Cervicalgia: Secondary | ICD-10-CM | POA: Diagnosis not present

## 2023-11-25 DIAGNOSIS — D72819 Decreased white blood cell count, unspecified: Secondary | ICD-10-CM

## 2023-11-25 DIAGNOSIS — D573 Sickle-cell trait: Secondary | ICD-10-CM | POA: Diagnosis not present

## 2023-11-25 DIAGNOSIS — G8929 Other chronic pain: Secondary | ICD-10-CM | POA: Diagnosis not present

## 2023-11-25 DIAGNOSIS — M62838 Other muscle spasm: Secondary | ICD-10-CM | POA: Diagnosis not present

## 2023-11-25 DIAGNOSIS — M5410 Radiculopathy, site unspecified: Secondary | ICD-10-CM | POA: Diagnosis not present

## 2023-11-25 DIAGNOSIS — D709 Neutropenia, unspecified: Secondary | ICD-10-CM | POA: Diagnosis not present

## 2023-11-25 DIAGNOSIS — N951 Menopausal and female climacteric states: Secondary | ICD-10-CM | POA: Diagnosis not present

## 2023-11-25 DIAGNOSIS — S8010XA Contusion of unspecified lower leg, initial encounter: Secondary | ICD-10-CM | POA: Diagnosis not present

## 2023-11-25 DIAGNOSIS — R61 Generalized hyperhidrosis: Secondary | ICD-10-CM | POA: Diagnosis not present

## 2023-11-25 LAB — CMP (CANCER CENTER ONLY)
ALT: 29 U/L (ref 0–44)
AST: 20 U/L (ref 15–41)
Albumin: 4.2 g/dL (ref 3.5–5.0)
Alkaline Phosphatase: 43 U/L (ref 38–126)
Anion gap: 5 (ref 5–15)
BUN: 20 mg/dL (ref 6–20)
CO2: 31 mmol/L (ref 22–32)
Calcium: 9.4 mg/dL (ref 8.9–10.3)
Chloride: 102 mmol/L (ref 98–111)
Creatinine: 1.13 mg/dL — ABNORMAL HIGH (ref 0.44–1.00)
GFR, Estimated: 59 mL/min — ABNORMAL LOW (ref >60)
Glucose, Bld: 81 mg/dL (ref 70–99)
Potassium: 4.1 mmol/L (ref 3.5–5.1)
Sodium: 138 mmol/L (ref 135–145)
Total Bilirubin: 0.4 mg/dL (ref 0.0–1.2)
Total Protein: 7.4 g/dL (ref 6.5–8.1)

## 2023-11-25 LAB — CBC WITH DIFFERENTIAL (CANCER CENTER ONLY)
Abs Immature Granulocytes: 0.05 K/uL (ref 0.00–0.07)
Basophils Absolute: 0.1 K/uL (ref 0.0–0.1)
Basophils Relative: 2 %
Eosinophils Absolute: 0.1 K/uL (ref 0.0–0.5)
Eosinophils Relative: 3 %
HCT: 37.4 % (ref 36.0–46.0)
Hemoglobin: 12.7 g/dL (ref 12.0–15.0)
Immature Granulocytes: 1 %
Lymphocytes Relative: 50 %
Lymphs Abs: 2.6 K/uL (ref 0.7–4.0)
MCH: 31.4 pg (ref 26.0–34.0)
MCHC: 34 g/dL (ref 30.0–36.0)
MCV: 92.3 fL (ref 80.0–100.0)
Monocytes Absolute: 0.5 K/uL (ref 0.1–1.0)
Monocytes Relative: 9 %
Neutro Abs: 1.8 K/uL (ref 1.7–7.7)
Neutrophils Relative %: 35 %
Platelet Count: 266 K/uL (ref 150–400)
RBC: 4.05 MIL/uL (ref 3.87–5.11)
RDW: 13.2 % (ref 11.5–15.5)
Smear Review: NORMAL
WBC Count: 5.2 K/uL (ref 4.0–10.5)
nRBC: 0 % (ref 0.0–0.2)

## 2023-11-25 LAB — VITAMIN B12: Vitamin B-12: 1377 pg/mL — ABNORMAL HIGH (ref 180–914)

## 2023-11-25 LAB — WBC/PLT IN CITRATE

## 2023-11-25 LAB — HEPATITIS B SURFACE ANTIBODY,QUALITATIVE: Hep B S Ab: NONREACTIVE

## 2023-11-25 LAB — C-REACTIVE PROTEIN: CRP: <0.5 mg/dL (ref <1.0)

## 2023-11-25 LAB — FOLATE: Folate: 9.2 ng/mL (ref >5.9)

## 2023-11-25 LAB — HEPATITIS B SURFACE ANTIGEN: Hepatitis B Surface Ag: NONREACTIVE

## 2023-11-26 LAB — HEPATITIS B CORE ANTIBODY, TOTAL: HEP B CORE AB: NEGATIVE

## 2023-11-28 ENCOUNTER — Emergency Department (HOSPITAL_COMMUNITY)

## 2023-11-28 ENCOUNTER — Other Ambulatory Visit: Payer: Self-pay

## 2023-11-28 ENCOUNTER — Encounter (HOSPITAL_COMMUNITY): Payer: Self-pay | Admitting: Emergency Medicine

## 2023-11-28 ENCOUNTER — Emergency Department (HOSPITAL_COMMUNITY)
Admission: EM | Admit: 2023-11-28 | Discharge: 2023-11-28 | Disposition: A | Discharge status: Home / Self Care | Attending: Emergency Medicine | Admitting: Emergency Medicine

## 2023-11-28 DIAGNOSIS — R55 Syncope and collapse: Secondary | ICD-10-CM | POA: Diagnosis not present

## 2023-11-28 DIAGNOSIS — I10 Essential (primary) hypertension: Secondary | ICD-10-CM | POA: Diagnosis not present

## 2023-11-28 DIAGNOSIS — R531 Weakness: Secondary | ICD-10-CM | POA: Diagnosis not present

## 2023-11-28 DIAGNOSIS — R42 Dizziness and giddiness: Secondary | ICD-10-CM | POA: Diagnosis not present

## 2023-11-28 DIAGNOSIS — I951 Orthostatic hypotension: Secondary | ICD-10-CM | POA: Diagnosis not present

## 2023-11-28 DIAGNOSIS — Z7982 Long term (current) use of aspirin: Secondary | ICD-10-CM | POA: Insufficient documentation

## 2023-11-28 DIAGNOSIS — E236 Other disorders of pituitary gland: Secondary | ICD-10-CM | POA: Diagnosis not present

## 2023-11-28 DIAGNOSIS — E860 Dehydration: Secondary | ICD-10-CM | POA: Diagnosis not present

## 2023-11-28 LAB — COMPREHENSIVE METABOLIC PANEL WITH GFR
ALT: 26 U/L (ref 0–44)
AST: 21 U/L (ref 15–41)
Albumin: 3.5 g/dL (ref 3.5–5.0)
Alkaline Phosphatase: 36 U/L — ABNORMAL LOW (ref 38–126)
Anion gap: 11 (ref 5–15)
BUN: 17 mg/dL (ref 6–20)
CO2: 23 mmol/L (ref 22–32)
Calcium: 8.2 mg/dL — ABNORMAL LOW (ref 8.9–10.3)
Chloride: 102 mmol/L (ref 98–111)
Creatinine, Ser: 0.99 mg/dL (ref 0.44–1.00)
GFR, Estimated: >60 mL/min (ref >60)
Glucose, Bld: 89 mg/dL (ref 70–99)
Potassium: 4.2 mmol/L (ref 3.5–5.1)
Sodium: 136 mmol/L (ref 135–145)
Total Bilirubin: 0.2 mg/dL (ref 0.0–1.2)
Total Protein: 6.3 g/dL — ABNORMAL LOW (ref 6.5–8.1)

## 2023-11-28 LAB — CBC
HCT: 35.3 % — ABNORMAL LOW (ref 36.0–46.0)
Hemoglobin: 11.5 g/dL — ABNORMAL LOW (ref 12.0–15.0)
MCH: 31.4 pg (ref 26.0–34.0)
MCHC: 32.6 g/dL (ref 30.0–36.0)
MCV: 96.4 fL (ref 80.0–100.0)
Platelets: 212 K/uL (ref 150–400)
RBC: 3.66 MIL/uL — ABNORMAL LOW (ref 3.87–5.11)
RDW: 13.3 % (ref 11.5–15.5)
WBC: 5.8 K/uL (ref 4.0–10.5)
nRBC: 0 % (ref 0.0–0.2)

## 2023-11-28 LAB — URINALYSIS, ROUTINE W REFLEX MICROSCOPIC
Bilirubin Urine: NEGATIVE
Glucose, UA: NEGATIVE mg/dL
Hgb urine dipstick: NEGATIVE
Ketones, ur: NEGATIVE mg/dL
Leukocytes,Ua: NEGATIVE
Nitrite: NEGATIVE
Protein, ur: NEGATIVE mg/dL
Specific Gravity, Urine: 1.003 — ABNORMAL LOW (ref 1.005–1.030)
pH: 7 (ref 5.0–8.0)

## 2023-11-28 LAB — TROPONIN I (HIGH SENSITIVITY)
Troponin I (High Sensitivity): 5 ng/L (ref <18)
Troponin I (High Sensitivity): 4 ng/L (ref <18)

## 2023-11-28 LAB — CBG MONITORING, ED: Glucose-Capillary: 83 mg/dL (ref 70–99)

## 2023-11-28 MED ORDER — ONDANSETRON HCL 4 MG/2ML IJ SOLN
4.0000 mg | Freq: Once | INTRAMUSCULAR | Status: DC
  Filled 2023-11-28: qty 2

## 2023-11-28 MED ORDER — GADOBUTROL 1 MMOL/ML IV SOLN
5.0000 mL | Freq: Once | INTRAVENOUS | Status: AC | PRN
  Administered 2023-11-28: 5 mL via INTRAVENOUS

## 2023-11-28 MED ORDER — SODIUM CHLORIDE 0.9 % IV BOLUS
500.0000 mL | Freq: Once | INTRAVENOUS | Status: AC
  Administered 2023-11-28: 500 mL via INTRAVENOUS

## 2023-11-28 NOTE — ED Notes (Signed)
 Pt back from MRI

## 2023-11-28 NOTE — ED Notes (Signed)
PT aware a urine sample is needed.

## 2023-11-28 NOTE — ED Notes (Signed)
 Patient transported to MRI

## 2023-11-28 NOTE — ED Notes (Signed)
 Pt ambulatory to the restroom

## 2023-11-28 NOTE — ED Triage Notes (Addendum)
 Patient from work where she had sudden onset of room-spinning dizziness and nausea causing her to have near syncopal episode. Patient had another syncopal episode with EMS en route. 4mg  zofran , 500 NS bolus given by EMS without relief in symptoms. Recently started on medrol dose pack for neck pain/spasms. Hx of vertigo.

## 2023-11-28 NOTE — ED Provider Notes (Signed)
 [place] EMERGENCY DEPARTMENT AT [facility] Provider Note   CSN: [id number] Arrival date & time: 11/28/23  1441     Patient presents with: Dizziness   Melinda Freeman is a 53 y.o. female.   Patient is a 53 yo female presenting for dizziness and presyncopal symptoms. Pt states she was urinating on the toilet when she began feeling light headed and dizzy (while still sitting). She states she returned to work when she began feeling worse and EMS was called. EMS reported that patient synopsized in route. Pt denies recent falls/head trauma. Denies black/blood stools, hematuria, or vaginal bleeding-hx of hysterectomy. No signs of infection, nausea, vomiting, fever, or uri symptoms. Drinking and eating normally-no recent profound dehydration.   The history is provided by the patient. No language interpreter was used.       Prior to Admission medications   Medication Sig Start Date End Date Taking? Authorizing Provider  albuterol  (PROVENTIL  HFA;VENTOLIN  HFA) 108 (90 Base) MCG/ACT inhaler Inhale 1-2 puffs into the lungs every 6 (six) hours as needed for wheezing or shortness of breath. 02/17/17  Yes Pennie Elsie PARAS, FNP  Aspirin-Acetaminophen-Caffeine (EXCEDRIN EXTRA STRENGTH PO) Take 2 tablets by mouth daily as needed (for pain).   Yes [provider]  baclofen  (LIORESAL ) 10 MG tablet Take 10 mg by mouth in the morning, at noon, and at bedtime.   Yes [provider]  Collagen-Vitamin C-Biotin (COLLAGEN 1500/C PO) Take 2 tablets by mouth daily.   Yes [provider]  famotidine  (PEPCID ) 40 MG tablet Take 40 mg by mouth daily as needed for indigestion. 09/12/23  Yes [provider]  MAGNESIUM PO Take 500 mg by mouth daily.   Yes [provider]  methylPREDNISolone (MEDROL DOSEPAK) 4 MG TBPK tablet Take by mouth as directed. 11/25/23  Yes [provider]  polyethylene glycol powder (GLYCOLAX /MIRALAX ) powder Take 17 g by mouth daily.  06/20/15  Yes Denney, Rachelle A, CNM  traZODone  (DESYREL ) 100 MG tablet Take 1 tablet (100 mg total) by mouth at bedtime. 07/27/23  Yes Caudle, Thersia Bitters, FNP  vitamin C (ASCORBIC ACID) 500 MG tablet Take 500 mg by mouth daily.   Yes [provider]  VITAMIN D PO Take 1,000 mg by mouth daily.   Yes [provider]    Allergies: Codeine, Hydrocodone, Percocet [oxycodone-acetaminophen], and Flexeril  [cyclobenzaprine ]    Review of Systems  Updated Vital Signs BP 129/81   Pulse 68   Temp 98.2 F (36.8 C) (Oral)   Resp (!) 22   Ht 5' 4 (1.626 m)   Wt 71 kg   SpO2 99%   BMI 26.87 kg/m   Physical Exam  (all labs ordered are listed, but only abnormal results are displayed) Labs Reviewed  COMPREHENSIVE METABOLIC PANEL WITH GFR - Abnormal; Notable for the following components:      Result Value   Calcium 8.2 (*)    Total Protein 6.3 (*)    Alkaline Phosphatase 36 (*)    All other components within normal limits  CBC - Abnormal; Notable for the following components:   RBC 3.66 (*)    Hemoglobin 11.5 (*)    HCT 35.3 (*)    All other components within normal limits  URINALYSIS, ROUTINE W REFLEX MICROSCOPIC - Abnormal; Notable for the following components:   Color, Urine COLORLESS (*)    Specific Gravity, Urine 1.003 (*)    All other components within normal limits  CBG MONITORING, ED  TROPONIN I (HIGH  SENSITIVITY)  TROPONIN I (HIGH SENSITIVITY)    EKG: EKG Interpretation Date/Time:  Monday November 28 2023 15:28:12 EST Ventricular Rate:  61 PR Interval:  134 QRS Duration:  80 QT Interval:  432 QTC Calculation: 434 R Axis:   66  Text Interpretation: Normal sinus rhythm Normal ECG When compared with ECG of 30-Jun-2023 10:33, PREVIOUS ECG IS PRESENT Confirmed by Laurice Coy [phone]) on 11/28/2023 3:37:55 PM  Radiology: DG Chest Portable 1 View Result Date: 11/28/2023 EXAM: 1 VIEW XRAY OF THE CHEST 11/28/2023 09:00:00 PM COMPARISON: 08/03/2014.  CLINICAL HISTORY: Syncope. FINDINGS: LUNGS AND PLEURA: No focal pulmonary opacity. No pulmonary edema. No pleural effusion. No pneumothorax. HEART AND MEDIASTINUM: No acute abnormality of the cardiac and mediastinal silhouettes. BONES AND SOFT TISSUES: No acute osseous abnormality. IMPRESSION: 1. No acute process. Electronically signed by: Pinkie Pebbles MD 11/28/2023 09:04 PM EST RP Workstation: [id number]   MR Brain W and Wo Contrast Result Date: 11/28/2023 CLINICAL DATA:  Initial evaluation for abnormal pituitary gland. EXAM: MRI HEAD WITHOUT AND WITH CONTRAST TECHNIQUE: Multiplanar, multiecho pulse sequences of the brain and surrounding structures were obtained without and with intravenous contrast. CONTRAST:  5mL GADAVIST GADOBUTROL 1 MMOL/ML IV SOLN COMPARISON:  CT from earlier the same day. FINDINGS: Brain: Cerebral volume within normal limits for age. No focal parenchymal signal abnormality. No abnormal foci of restricted diffusion to suggest acute or subacute ischemia. Lexie Koehl-white matter differentiation well maintained. No encephalomalacia to suggest chronic cortical infarction or other insult. No foci of susceptibility artifact indicative of acute or chronic intracranial blood products. No mass lesion, midline shift or mass effect. Ventricles normal in size and morphology without hydrocephalus. No extra-axial fluid collection. Expansile lobulated cystic lesion involving the sella and pituitary gland is seen, corresponding with abnormality on prior CT. Lesion measures 2.3 x 1.5 x 1.7 cm in greatest dimensions (transverse by craniocaudad by AP). Lesion demonstrates hyperintense T2 signal intensity with some intrinsic T1 hyperintensity. Internal fluid-fluid level. No significant associated enhancement. 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 Native pituitary gland appears draped over about the superior left aspect of this lesion (series 11, image 9). Few additional smaller  subcentimeter cystic foci within the pituitary gland noted as well. Probable mild cavernous sinus invasion on the right (series 11, image 10). Suprasellar extension with mild mass effect on the optic chiasm superiorly, greater on the right (series 1203, image 5). Vascular: Major intracranial vascular flow voids are well maintained. Skull and upper cervical spine: Craniocervical junction within normal limits. Visualized upper cervical spine demonstrates no significant finding. Bone marrow signal intensity within normal limits. No scalp soft tissue abnormality. Sinuses/Orbits: Globes and orbital soft tissues are within normal limits. Paranasal sinuses are largely clear. No significant mastoid effusion. Other: None. 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 Associated suprasellar extension with mild mass effect on the optic chiasm superiorly and mild right cavernous sinus extension/invasion. Electronically Signed   By: Morene Hoard M.D.   On: 11/28/2023 20:56   CT HEAD WO CONTRAST Result Date: 11/28/2023 CLINICAL DATA:  Vertigo EXAM: CT HEAD WITHOUT CONTRAST TECHNIQUE: Contiguous axial images were obtained from the base of the skull through the vertex without intravenous contrast. RADIATION DOSE REDUCTION: This exam was performed according to the departmental dose-optimization program which includes automated exposure control, adjustment of the mA and/or kV according to patient size and/or use of iterative reconstruction technique. COMPARISON:  None Available. FINDINGS: Brain: No acute territorial infarction or hemorrhage. Hypodense pituitary mass measuring about 2.1 x 1.9 by 2 cm, possible right cavernous sinus involvement. Normal ventricle size. Vascular: No hyperdense vessels.  No unexpected calcification. Skull: No fracture Sinuses/Orbits: No acute finding. Other: Nonspecific foci of soft tissue gas  IMPRESSION: 1. No CT evidence for acute intracranial abnormality. 2. Hypodense pituitary mass measuring up to 2.1 cm, possible right cavernous sinus involvement. Recommend endocrine and/or neurosurgical referral for further management. Electronically Signed   By: Luke Bun M.D.   On: 11/28/2023 16:14     Procedures   Medications Ordered in the ED  ondansetron  (ZOFRAN ) injection 4 mg (4 mg Intravenous Not Given 11/28/23 2053)  sodium chloride  0.9 % bolus 500 mL (0 mLs Intravenous Stopped 11/28/23 2049)  gadobutrol (GADAVIST) 1 MMOL/ML injection 5 mL (5 mLs Intravenous Contrast Given 11/28/23 2018)                                    Medical Decision Making Amount and/or Complexity of Data Reviewed Labs: ordered. Radiology: ordered.  Risk Prescription drug management.   Patient is a 53 year old female presenting for dizziness and presyncopal symptoms and then had syncope and route with EMS.  Patient is alert and oriented x 3, no acute distress, afebrile, stable vital signs.  GCS of 15. No neurovascular deficits on exam.   Upon chart review patient has been following with heme-onc for leukopenia/neutropenia and a history of B vitamin B-12 deficiency.  She had recent laboratory studies that were taken that show elevated B12 results.  She states she is no longer taking her medications after her last labs demonstrated elevated B12 levels.  Her leukopenia is resolved at this time.  Syncope: - Blood pressure upon arrival 166/93.  Improved after approximately 10 minutes of being roomed to 142/83 and has remained that for the last 3 hours.  Patient did have orthostatic hypotension recorded with a 20 systolic blood pressure drop upon standing and heart rate increased by 10 bpm.  Please see nursing documentation for orthostatic numbers.  Recommended to increase salt and fluids.  She was given IV fluids here in ED.  Feeling well and able to ambulate without difficulty at this time. - No  hypoglycemia - Stable electrolytes - No signs or symptoms of infection or sepsis - Hemoglobin stable at 11.5 - Stable creatinine and BUN - No overt dehydration - CT demonstrates possible pituitary gland involvement.  MRI recommended.  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 Associated suprasellar extension with mild mass effect on the optic chiasm superiorly and mild right cavernous sinus extension/invasion.  Findings were discussed with our on-call neurosurgery team Dr. Lanis who states the syncope is likely unrelated.  He recommends patient has a ophthalmology exam for peripheral field deficits as well as follow-up outpatient with him.  Patient agreeable plan.  Her orthostatics were stable.  She is otherwise feeling well this time and safe for discharge.  Likely an incidental finding.  Patient in no distress and overall condition improved here in the ED. Detailed discussions were had with the patient regarding current findings, and need for close f/u with PCP or on call doctor. The patient has been instructed to return immediately if the symptoms worsen in any way for re-evaluation. Patient verbalized understanding and is  in agreement with current care plan. All questions answered prior to discharge.        Final diagnoses:  Vasovagal syncope  Orthostatic hypotension    ED Discharge Orders     None          Elnor Bernarda SQUIBB, DO 11/28/23 2242

## 2023-11-29 ENCOUNTER — Telehealth: Payer: Self-pay | Admitting: Hematology and Oncology

## 2023-11-29 NOTE — Telephone Encounter (Signed)
 Scheduled patient for next appointment. Called and spoke with the patient, she is aware.

## 2023-12-02 ENCOUNTER — Encounter: Payer: Self-pay | Admitting: Physician Assistant

## 2023-12-03 LAB — METHYLMALONIC ACID, SERUM: Methylmalonic Acid, Quantitative: 123 nmol/L (ref 0–378)

## 2023-12-08 ENCOUNTER — Ambulatory Visit (INDEPENDENT_AMBULATORY_CARE_PROVIDER_SITE_OTHER): Admitting: Family Medicine

## 2023-12-08 VITALS — BP 110/71 | HR 72 | Ht 64.0 in | Wt 158.0 lb

## 2023-12-08 DIAGNOSIS — M79675 Pain in left toe(s): Secondary | ICD-10-CM | POA: Diagnosis not present

## 2023-12-08 DIAGNOSIS — M25512 Pain in left shoulder: Secondary | ICD-10-CM | POA: Diagnosis not present

## 2023-12-08 DIAGNOSIS — E236 Other disorders of pituitary gland: Secondary | ICD-10-CM | POA: Insufficient documentation

## 2023-12-08 MED ORDER — METHOCARBAMOL 500 MG PO TABS: 500.0000 mg | ORAL_TABLET | Freq: Three times a day (TID) | ORAL | 0 refills | Status: DC

## 2023-12-08 NOTE — Assessment & Plan Note (Signed)
 Chronic neck pain with muscle spasms and left shoulder pain. Baclofen  has become less effective. Previous therapies provided limited relief. Symptoms include severe pain, finger numbness, and breathing difficulty during spasms. Methocarbamol considered as alternative muscle relaxer. - Consider referral to orthopedic surgeon for further evaluation. She does have appt with neurosurgeon that she has seen previously - Discuss with neurosurgeon during upcoming appointment. - Consider physical therapy referral. - Prescribed methocarbamol as an alternative muscle relaxer.

## 2023-12-08 NOTE — Assessment & Plan Note (Signed)
 Acute left toe pain. Differential includes joint irritation or minor bony injury. - Monitor symptoms and consider x-ray if pain persists or worsens.

## 2023-12-08 NOTE — Assessment & Plan Note (Signed)
 Pituitary cyst with chronic headaches, dizziness Moderate-sized pituitary cyst identified on imaging, potentially contributing to symptoms. Neurosurgeon does not associate cyst with recent ER visit symptoms. - Attend ophthalmologist appointment on Wednesday. - Attend neurosurgeon appointment on December 3rd.

## 2023-12-08 NOTE — Progress Notes (Signed)
 Procedures performed today:    None.  Independent interpretation of notes and tests performed by another provider:   None.  Brief History, Exam, Impression, and Recommendations:    BP 110/71 (BP Location: Right Arm, Patient Position: Sitting, Cuff Size: Normal)   Pulse 72   Ht 5' 4 (1.626 m)   Wt 158 lb (71.7 kg)   SpO2 100%   BMI 27.12 kg/m   Discussed the use of AI scribe software for clinical note transcription with the patient, who gave verbal consent to proceed.  History of Present Illness Melinda Freeman is a 53 year old female who presents with dizziness and headaches following an ER visit for syncope.  She experienced dizziness and syncope twice at her workplace before EMS arrived. Upon EMS arrival, she experienced another syncopal episode when she attempted to stand her up. In the ER, imaging including a CT scan and MRI revealed a cyst near the pituitary gland, measuring approximately 1.5 to 2 centimeters. She has persistent dizziness, especially with head movement, and frequent headaches. Her vision has been intermittently blurry for the past six months, initially attributed to aging. These symptoms have been ongoing for a long time, and she has been managing them by taking medication and continuing with her daily activities.  She has a history of muscle spasms that began after receiving COVID-19 vaccinations in 2022. These spasms cause significant neck pain and numbness in her fingers, described as severe enough to 'take her breath away.' She has tried antibiotics and muscle relaxers like baclofen , which she has been taking frequently over the past two months without relief. She has previously seen a chiropractor and a physical therapist for these symptoms, but the spasms have recurred with increased intensity.  Recently, she developed a new issue with her toe, pain with pushing off on foot/toe. She is unsure of the cause, as she did not experience any trauma.  On exam,  patient is in no acute distress.  Vital signs stable.  On palpation about left shoulder and neck, there is some muscle spasm noted with tenderness to palpation.  Left great toe with no bony tenderness, normal passive range of motion at MTP joint.  No appreciable swelling.  Overall normal exam.  Left shoulder pain, unspecified chronicity Assessment & Plan: Chronic neck pain with muscle spasms and left shoulder pain. Baclofen  has become less effective. Previous therapies provided limited relief. Symptoms include severe pain, finger numbness, and breathing difficulty during spasms. Methocarbamol considered as alternative muscle relaxer. - Consider referral to orthopedic surgeon for further evaluation. She does have appt with neurosurgeon that she has seen previously - Discuss with neurosurgeon during upcoming appointment. - Consider physical therapy referral. - Prescribed methocarbamol as an alternative muscle relaxer.   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 Neurosurgeon does not associate cyst with recent ER visit symptoms. - Attend ophthalmologist appointment on Wednesday. - Attend neurosurgeon appointment on December 3rd.   Toe pain, left Assessment & Plan: Acute left toe pain. Differential includes joint irritation or minor bony injury. - Monitor symptoms and consider x-ray if pain persists or worsens.   Other orders -     Methocarbamol; Take 1 tablet (500 mg total) by mouth 3 (three) times daily.  Dispense: 90 tablet; Refill: 0  Return if symptoms worsen or fail to improve.  Spent 32 minutes on this patient encounter, including preparation, chart review, face-to-face counseling with  patient and coordination of care, and documentation of encounter   ___________________________________________ Zakhi Dupre de [place], MD, ABFM, CAQSM Primary Care and  Sports Medicine [facility]

## 2023-12-14 DIAGNOSIS — D352 Benign neoplasm of pituitary gland: Secondary | ICD-10-CM | POA: Diagnosis not present

## 2023-12-14 DIAGNOSIS — H53453 Other localized visual field defect, bilateral: Secondary | ICD-10-CM | POA: Diagnosis not present

## 2023-12-18 ENCOUNTER — Encounter (HOSPITAL_BASED_OUTPATIENT_CLINIC_OR_DEPARTMENT_OTHER): Payer: Self-pay | Admitting: Family Medicine

## 2023-12-21 ENCOUNTER — Encounter (HOSPITAL_BASED_OUTPATIENT_CLINIC_OR_DEPARTMENT_OTHER): Payer: Self-pay | Admitting: Family Medicine

## 2023-12-21 ENCOUNTER — Ambulatory Visit (INDEPENDENT_AMBULATORY_CARE_PROVIDER_SITE_OTHER): Admitting: Family Medicine

## 2023-12-21 VITALS — BP 146/97 | HR 60 | Ht 64.0 in | Wt 157.0 lb

## 2023-12-21 DIAGNOSIS — E236 Other disorders of pituitary gland: Secondary | ICD-10-CM | POA: Diagnosis not present

## 2023-12-21 DIAGNOSIS — D72819 Decreased white blood cell count, unspecified: Secondary | ICD-10-CM | POA: Diagnosis not present

## 2023-12-21 DIAGNOSIS — M79675 Pain in left toe(s): Secondary | ICD-10-CM

## 2023-12-21 DIAGNOSIS — Z0000 Encounter for general adult medical examination without abnormal findings: Secondary | ICD-10-CM | POA: Diagnosis not present

## 2023-12-21 DIAGNOSIS — Z13220 Encounter for screening for lipoid disorders: Secondary | ICD-10-CM | POA: Diagnosis not present

## 2023-12-21 NOTE — Patient Instructions (Signed)
 Go to 2nd Floor Orthopedics for xray    Please return for fasting blood work.  For fasting, if your blood work is in the morning please do not eat any food after midnight.  You may have water or black coffee prior to your lab work.  Please take all regularly prescribed medications even if you are fasting.  If your blood work is in the afternoon, please fast for at least 5 to 6 hours.  You may continue to drink water and/or black coffee prior to your lab work.  Please take all scheduled medications even if you are fasting.   Things to do to keep yourself healthy: - Exercise at least 30-45 minutes a day, 3-4 days a week.  - Eat a low-fat diet with lots of fruits and vegetables, up to 7-9 servings per day.  - Seatbelts can save your life. Wear them always.  - Smoke detectors on every level of your home, check batteries every year.  - Eye Doctor - have an eye exam every 1-2 years  - Safe sex - if you may be exposed to STDs, use a condom.  - No smoking, vaping, or use of any tobacco products.  - Alcohol -  If you drink, do it moderately, less than 2 drinks per day.  - No illegal drug use.  - Depression is common in our stressful world.If you're feeling down or losing interest in things you normally enjoy, please come in for a visit.  - Violence - If anyone is threatening or hurting you, please call immediately.

## 2023-12-21 NOTE — Progress Notes (Unsigned)
 Subjective:   Melinda Freeman August 09, 1970  12/21/2023   CC: Chief Complaint  Patient presents with   Annual Exam    Pt is here today for her physical. Denies any main concerns other than still having some foot pain.    HPI: Melinda Freeman is a 53 y.o. female who presents for a routine health maintenance exam.  Labs collected at time of visit.   Pituitary CYST:  Patient went to the ER 11/28/2023 for syncopal episode.  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  Her imaging was evaluated by neurosurgery and she was recommended to follow-up with ophthalmology and neurosurgery outpatient.  She has an appointment coming up this week with ophthalmology and has an appointment on 01/03/2024 with neurosurgery Dr. Malcolm with [facility] neurosurgery.  She does report ongoing double vision to the right eye that is corrected with glasses and mild dizziness with sudden movements.    HEALTH SCREENINGS: - Vision Screening: UTD; seeing Ophthalmology on Friday  - Dental Visits: up to date - Pap smear: not applicable - Breast Exam: Declined - STD Screening: Declined - Mammogram (40+): Up to date  - Colonoscopy (45+): Up to date  - Bone Density (65+ or under 65 with predisposing conditions): Not applicable  - Lung CA screening with low-dose CT:  Not applicable Adults age 59-80 who are current cigarette smokers or quit within the last 15 years. Must have 20 pack year history.   Depression and Anxiety Screen done today and results listed below:     12/21/2023    2:28 PM 11/25/2023    8:00 AM 09/28/2023    8:41 AM 06/30/2023    9:36 AM 02/19/2013    4:47 PM  Depression screen PHQ 2/9  Decreased Interest 0 0 0 0 0  Down, Depressed, Hopeless 0 0 0 0 0  PHQ - 2 Score 0 0 0 0 0  Altered sleeping 0 0 0 0   Tired, decreased energy 0 0 0 0   Change in appetite 0 0 0 0   Feeling bad or failure about yourself  0 0 0 0   Trouble concentrating 0 0 0 0   Moving slowly or  fidgety/restless 0 0 0 0   Suicidal thoughts 0 0 0 0   PHQ-9 Score 0 0 0  0    Difficult doing work/chores Not difficult at all  Not difficult at all Not difficult at all      Data saved with a previous flowsheet row definition      12/21/2023    2:29 PM 09/28/2023    8:41 AM 06/30/2023    9:36 AM  GAD 7 : Generalized Anxiety Score  Nervous, Anxious, on Edge 0 0 0  Control/stop worrying 0 0 0  Worry too much - different things 0 0 0  Trouble relaxing 0 0 0  Restless 0 0 0  Easily annoyed or irritable 0 0 0  Afraid - awful might happen 0 0 0  Total GAD 7 Score 0 0 0  Anxiety Difficulty Not difficult at all Not difficult at all Not difficult at all    IMMUNIZATIONS: - Tdap: Tetanus vaccination status reviewed: last tetanus booster within 10 years. - HPV: Not applicable - Influenza: Up to date - Pneumovax: Not applicable - Prevnar 20: Not applicable - Shingrix (50+): Up to date   Past medical history, surgical history, medications, allergies, family history and social history reviewed  with patient today and changes made to appropriate areas of the chart.   Past Medical History:  Diagnosis Date   Allergy    Anxiety    Asthma    adult onset   Chronic headaches    Chronic kidney disease    Constipation    GERD (gastroesophageal reflux disease)    History of kidney stones    Leukopenia    Sleep apnea     Past Surgical History:  Procedure Laterality Date   ABDOMINAL HYSTERECTOMY     CESAREAN SECTION     COLONOSCOPY     kidney stones     x 3, also had a stent    Current Outpatient Medications on File Prior to Visit  Medication Sig   albuterol  (PROVENTIL  HFA;VENTOLIN  HFA) 108 (90 Base) MCG/ACT inhaler Inhale 1-2 puffs into the lungs every 6 (six) hours as needed for wheezing or shortness of breath.   Aspirin-Acetaminophen-Caffeine (EXCEDRIN EXTRA STRENGTH PO) Take 2 tablets by mouth daily as needed (for pain).   Collagen-Vitamin C-Biotin (COLLAGEN 1500/C PO) Take 2  tablets by mouth daily.   famotidine  (PEPCID ) 40 MG tablet Take 40 mg by mouth daily as needed for indigestion.   MAGNESIUM PO Take 500 mg by mouth daily.   methocarbamol  (ROBAXIN ) 500 MG tablet Take 1 tablet (500 mg total) by mouth 3 (three) times daily.   polyethylene glycol powder (GLYCOLAX /MIRALAX ) powder Take 17 g by mouth daily.   traZODone  (DESYREL ) 100 MG tablet Take 1 tablet (100 mg total) by mouth at bedtime.   vitamin C (ASCORBIC ACID) 500 MG tablet Take 500 mg by mouth daily.   VITAMIN D PO Take 1,000 mg by mouth daily.   No current facility-administered medications on file prior to visit.    Allergies  Allergen Reactions   Codeine Other (See Comments)    Fainting    Hydrocodone Anaphylaxis   Percocet [Oxycodone-Acetaminophen] Anaphylaxis   Flexeril  [Cyclobenzaprine ]     Problems with nausea and also made her extremely drowsy. Dizziness, faint felt like I would pass out     Social History   Socioeconomic History   Marital status: Single    Spouse name: Not on file   Number of children: 4   Years of education: Not on file   Highest education level: Some college, no degree  Occupational History   Occupation: customer service rep  Tobacco Use   Smoking status: Never   Smokeless tobacco: Never   Tobacco comments:    Never smoked  Vaping Use   Vaping status: Never Used  Substance and Sexual Activity   Alcohol use: Not Currently    Comment: wine once or twice a month   Drug use: Never   Sexual activity: Yes    Birth control/protection: Surgical  Other Topics Concern   Not on file  Social History Narrative   Not on file   Social Drivers of Health   Financial Resource Strain: Low Risk  (12/20/2023)   Overall Financial Resource Strain (CARDIA)    Difficulty of Paying Living Expenses: Not hard at all  Food Insecurity: No Food Insecurity (12/20/2023)   Hunger Vital Sign    Worried About Running Out of Food in the Last Year: Never true    Ran Out of Food  in the Last Year: Never true  Transportation Needs: No Transportation Needs (12/20/2023)   PRAPARE - Administrator, Civil Service (Medical): No    Lack of Transportation (Non-Medical): No  Physical  Activity: Sufficiently Active (12/20/2023)   Exercise Vital Sign    Days of Exercise per Week: 5 days    Minutes of Exercise per Session: 30 min  Recent Concern: Physical Activity - Insufficiently Active (09/27/2023)   Exercise Vital Sign    Days of Exercise per Week: 5 days    Minutes of Exercise per Session: 20 min  Stress: No Stress Concern Present (12/20/2023)   [employer] of Occupational Health - Occupational Stress Questionnaire    Feeling of Stress: Not at all  Social Connections: Moderately Integrated (12/20/2023)   Social Connection and Isolation Panel    Frequency of Communication with Friends and Family: More than three times a week    Frequency of Social Gatherings with Friends and Family: Once a week    Attends Religious Services: More than 4 times per year    Active Member of [employer] or Organizations: No    Attends Engineer, Structural: Not on file    Marital Status: Living with partner  Intimate Partner Violence: Not At Risk (11/25/2023)   Humiliation, Afraid, Rape, and Kick questionnaire    Fear of Current or Ex-Partner: No    Emotionally Abused: No    Physically Abused: No    Sexually Abused: No   Social History   Tobacco Use  Smoking Status Never  Smokeless Tobacco Never  Tobacco Comments   Never smoked   Social History   Substance and Sexual Activity  Alcohol Use Not Currently   Comment: wine once or twice a month    Family History  Problem Relation Age of Onset   Hypertension Mother    Cancer Mother    Hypertension Father    Colon cancer Maternal Aunt    Diabetes Maternal Aunt    Colon polyps Maternal Aunt        x 2   Colon polyps Cousin        x 2   Stomach cancer Neg Hx    Rectal cancer Neg Hx    Esophageal cancer Neg Hx       ROS: Denies fever, fatigue, unexplained weight loss/gain, chest pain, SHOB, and palpitations. Denies neurological deficits, gastrointestinal or genitourinary complaints, and skin changes.   Objective:   Today's Vitals   12/21/23 1421 12/21/23 1600  BP: (!) 146/97 128/86  Pulse: 60   SpO2: 100%   Weight: 157 lb (71.2 kg)   Height: 5' 4 (1.626 m)     GENERAL APPEARANCE: Well-appearing, in NAD. Well nourished.  SKIN: Pink, warm and dry. Turgor normal. No rash, lesion, ulceration, or ecchymoses. Hair evenly distributed.  HEENT: HEAD: Normocephalic.  EYES: PERRLA. EOMI. Lids intact w/o defect. Sclera white, Conjunctiva pink w/o exudate.  EARS: External ear w/o redness, swelling, masses or lesions. EAC clear. TM's intact, translucent w/o bulging, appropriate landmarks visualized. Appropriate acuity to conversational tones.  NOSE: Septum midline w/o deformity. Nares patent, mucosa pink and non-inflamed w/o drainage. No sinus tenderness.  THROAT: Uvula midline. Oropharynx clear. Tonsils non-inflamed w/o exudate. Oral mucosa pink and moist.  NECK: Supple, Trachea midline. Full ROM w/o pain or tenderness. No lymphadenopathy. Thyroid non-tender w/o enlargement or palpable masses.  RESPIRATORY: Chest wall symmetrical w/o masses. Respirations even and non-labored. Breath sounds clear to auscultation bilaterally. No wheezes, rales, rhonchi, or crackles. CARDIAC: S1, S2 present, regular rate and rhythm. No gallops, murmurs, rubs, or clicks. PMI w/o lifts, heaves, or thrills. No carotid bruits. Capillary refill <2 seconds. Peripheral pulses 2+ bilaterally. GI: Abdomen soft w/o  distention. Normoactive bowel sounds. No palpable masses or tenderness. No guarding or rebound tenderness. Liver and spleen w/o tenderness or enlargement. No CVA tenderness.  MSK: Muscle tone and strength appropriate for age, w/o atrophy or abnormal movement.  Mild pain to left great toe with palpation. EXTREMITIES: Active  ROM intact, w/o tenderness, crepitus, or contracture. No obvious joint deformities or effusions. No clubbing, edema, or cyanosis.  NEUROLOGIC: CN's II-XII intact. Motor strength symmetrical with no obvious weakness. No sensory deficits. DTR's 2+ symmetric bilaterally. Steady, even gait.  PSYCH/MENTAL STATUS: Alert, oriented x 3. Cooperative, appropriate mood and affect.     Assessment & Plan:  1. Annual physical exam (Primary) Discussed preventative screenings, vaccines, and healthy lifestyle with patient.  CBC collected in the past month was unremarkable.  - Comprehensive metabolic panel with GFR; Future - TSH; Future  2. Leukopenia, unspecified type Patient currently seen by oncology and has follow-up scheduled to continue monitoring of leukopenia.  3. Rathke's cleft cyst Patient directed to follow-up with neurology in the next week for scheduled appointment and continue with ophthalmology evaluation for pituitary cyst.  Cussed red flag signs and symptoms to seek emergency services with  4. Screening for lipid disorders - Lipid panel; Future  5. Toe pain, left Patient reports ongoing pain to left great toe for over 1 to 2 months.  Pain worsens with standing.  Will obtain imaging to look for possible stress fracture given signs and symptoms. - DG Toe Great Left; Future   Orders Placed This Encounter  Procedures   DG Toe Great Left    Standing Status:   Future    Expiration Date:   12/20/2024    Reason for exam::   Chronic Pain to left great toe ongoing x 1-2 months; pain worsening with metatarsal pressure    Is the patient pregnant?:   No    Preferred imaging location?:   [facility]    Release to patient:   Immediate   Comprehensive metabolic panel with GFR    Standing Status:   Future    Number of Occurrences:   1    Expiration Date:   12/20/2024   Lipid panel    Standing Status:   Future    Number of Occurrences:   1    Expiration Date:   12/20/2024   TSH     Standing Status:   Future    Number of Occurrences:   1    Expiration Date:   12/20/2024    PATIENT COUNSELING:  - Encouraged a healthy well-balanced diet. Patient may adjust caloric intake to maintain or achieve ideal body weight. May reduce intake of dietary saturated fat and total fat and have adequate dietary potassium and calcium preferably from fresh fruits, vegetables, and low-fat dairy products.   - Advised to avoid cigarette smoking. - Discussed with the patient that most people either abstain from alcohol or drink within safe limits (<=14/week and <=4 drinks/occasion for males, <=7/weeks and <= 3 drinks/occasion for females) and that the risk for alcohol disorders and other health effects rises proportionally with the number of drinks per week and how often a drinker exceeds daily limits. - Discussed cessation/primary prevention of drug use and availability of treatment for abuse.  - Discussed sexually transmitted diseases, avoidance of unintended pregnancy and contraceptive alternatives.  - Stressed the importance of regular exercise - Injury prevention: Discussed safety belts, safety helmets, smoke detector, smoking near bedding or upholstery.  - Dental health: Discussed importance of regular  tooth brushing, flossing, and dental visits.   NEXT PREVENTATIVE PHYSICAL DUE IN 1 YEAR.  Return in about 1 year (around 12/20/2024) for ANNUAL PHYSICAL.  Patient to reach out to office if new, worrisome, or unresolved symptoms arise or if no improvement in patient's condition. Patient verbalized understanding and is agreeable to treatment plan. All questions answered to patient's satisfaction.    Thersia Schuyler Stark, [place]

## 2023-12-22 ENCOUNTER — Other Ambulatory Visit (HOSPITAL_BASED_OUTPATIENT_CLINIC_OR_DEPARTMENT_OTHER): Payer: Self-pay | Admitting: Family Medicine

## 2023-12-22 DIAGNOSIS — Z13220 Encounter for screening for lipoid disorders: Secondary | ICD-10-CM

## 2023-12-22 DIAGNOSIS — Z0000 Encounter for general adult medical examination without abnormal findings: Secondary | ICD-10-CM

## 2023-12-23 DIAGNOSIS — H53453 Other localized visual field defect, bilateral: Secondary | ICD-10-CM | POA: Diagnosis not present

## 2023-12-23 LAB — LIPID PANEL
Chol/HDL Ratio: 3 ratio (ref 0.0–4.4)
Cholesterol, Total: 301 mg/dL — ABNORMAL HIGH (ref 100–199)
HDL: 100 mg/dL (ref >39)
LDL Chol Calc (NIH): 186 mg/dL — ABNORMAL HIGH (ref 0–99)
Triglycerides: 93 mg/dL (ref 0–149)
VLDL Cholesterol Cal: 15 mg/dL (ref 5–40)

## 2023-12-23 LAB — COMPREHENSIVE METABOLIC PANEL WITH GFR
ALT: 27 IU/L (ref 0–32)
AST: 27 IU/L (ref 0–40)
Albumin: 4.3 g/dL (ref 3.8–4.9)
Alkaline Phosphatase: 58 IU/L (ref 49–135)
BUN/Creatinine Ratio: 11 (ref 9–23)
BUN: 11 mg/dL (ref 6–24)
Bilirubin Total: 0.2 mg/dL (ref 0.0–1.2)
CO2: 24 mmol/L (ref 20–29)
Calcium: 9.7 mg/dL (ref 8.7–10.2)
Chloride: 101 mmol/L (ref 96–106)
Creatinine, Ser: 0.97 mg/dL (ref 0.57–1.00)
Globulin, Total: 2.6 g/dL (ref 1.5–4.5)
Glucose: 88 mg/dL (ref 70–99)
Potassium: 4.7 mmol/L (ref 3.5–5.2)
Sodium: 143 mmol/L (ref 134–144)
Total Protein: 6.9 g/dL (ref 6.0–8.5)
eGFR: 71 mL/min/1.73 (ref >59)

## 2023-12-23 LAB — TSH: TSH: 1.55 u[IU]/mL (ref 0.450–4.500)

## 2023-12-25 ENCOUNTER — Ambulatory Visit (HOSPITAL_BASED_OUTPATIENT_CLINIC_OR_DEPARTMENT_OTHER): Payer: Self-pay | Admitting: Family Medicine

## 2023-12-25 NOTE — Progress Notes (Signed)
 Melinda Freeman,  Your thyroid function and Cmp are stable. Your total cholesterol and Ldl are significantly elevated. Had you eaten prior to these labs being drawn? If not, I am concerned about your cholesterol being so high and would recommend medication. Please let me know if you were fasting.

## 2023-12-27 ENCOUNTER — Emergency Department (HOSPITAL_BASED_OUTPATIENT_CLINIC_OR_DEPARTMENT_OTHER)
Admission: EM | Admit: 2023-12-27 | Discharge: 2023-12-27 | Disposition: A | Discharge status: Home / Self Care | Attending: Emergency Medicine | Admitting: Emergency Medicine

## 2023-12-27 ENCOUNTER — Emergency Department (HOSPITAL_BASED_OUTPATIENT_CLINIC_OR_DEPARTMENT_OTHER)

## 2023-12-27 ENCOUNTER — Other Ambulatory Visit: Payer: Self-pay

## 2023-12-27 ENCOUNTER — Encounter (HOSPITAL_BASED_OUTPATIENT_CLINIC_OR_DEPARTMENT_OTHER): Payer: Self-pay | Admitting: Emergency Medicine

## 2023-12-27 DIAGNOSIS — R42 Dizziness and giddiness: Secondary | ICD-10-CM

## 2023-12-27 DIAGNOSIS — Z7982 Long term (current) use of aspirin: Secondary | ICD-10-CM | POA: Diagnosis not present

## 2023-12-27 DIAGNOSIS — R110 Nausea: Secondary | ICD-10-CM | POA: Diagnosis not present

## 2023-12-27 DIAGNOSIS — E236 Other disorders of pituitary gland: Secondary | ICD-10-CM | POA: Diagnosis not present

## 2023-12-27 DIAGNOSIS — R0602 Shortness of breath: Secondary | ICD-10-CM | POA: Diagnosis not present

## 2023-12-27 LAB — CBC
HCT: 37.5 % (ref 36.0–46.0)
Hemoglobin: 12.7 g/dL (ref 12.0–15.0)
MCH: 31.6 pg (ref 26.0–34.0)
MCHC: 33.9 g/dL (ref 30.0–36.0)
MCV: 93.3 fL (ref 80.0–100.0)
Platelets: 255 K/uL (ref 150–400)
RBC: 4.02 MIL/uL (ref 3.87–5.11)
RDW: 13.2 % (ref 11.5–15.5)
WBC: 4.2 K/uL (ref 4.0–10.5)
nRBC: 0 % (ref 0.0–0.2)

## 2023-12-27 LAB — BASIC METABOLIC PANEL WITH GFR
Anion gap: 10 (ref 5–15)
BUN: 19 mg/dL (ref 6–20)
CO2: 27 mmol/L (ref 22–32)
Calcium: 10.6 mg/dL — ABNORMAL HIGH (ref 8.9–10.3)
Chloride: 104 mmol/L (ref 98–111)
Creatinine, Ser: 1.06 mg/dL — ABNORMAL HIGH (ref 0.44–1.00)
GFR, Estimated: >60 mL/min (ref >60)
Glucose, Bld: 88 mg/dL (ref 70–99)
Potassium: 3.9 mmol/L (ref 3.5–5.1)
Sodium: 140 mmol/L (ref 135–145)

## 2023-12-27 MED ORDER — MECLIZINE HCL 25 MG PO TABS: 25.0000 mg | ORAL_TABLET | Freq: Three times a day (TID) | ORAL | 0 refills | Status: AC | PRN

## 2023-12-27 NOTE — ED Triage Notes (Signed)
 Pt caox4 ambulatory c/o SOB and nausea since approx 930, further reporting intermittent (3 total) episodes of blurred vision since. Pt states 11/10 she was seen in ED for syncope and dx with a brain lesion. LKW just before 9a.

## 2023-12-27 NOTE — Discharge Instructions (Addendum)
 Melinda Freeman  Thank you for allowing us  to take care of you today.  You came to the Emergency Department today because you have had dizziness and intermittent vision changes for some time.  Here in the emergency department we spoke with Dr. Malcolm the neurosurgeon who recommended CT head to assess for any bleeding from your known Rathke cleft cyst.  The CT shows no evidence of bleeding, and shows that it is overall stable size.  It may be putting some pressure on your optic nerves which could potentially explain your vision changes.  You should follow-up closely with Dr. Malcolm next week in clinic, to further decide next steps such as obtaining the MRI.  To-Do: 1. Please follow-up with your primary doctor within 1 - 2 weeks / as soon as possible.   Please return to the Emergency Department or call 911 if you experience have worsening of your symptoms, or do not get better, chest pain, shortness of breath, severe or significantly worsening pain, high fever, severe confusion, pass out or have any reason to think that you need emergency medical care.   We hope you feel better soon.   Department of Emergency Medicine [facility]

## 2023-12-27 NOTE — ED Provider Notes (Signed)
 [place] EMERGENCY DEPARTMENT AT [facility] Provider Note   CSN: [id number] Arrival date & time: 12/27/23  1220     Patient presents with: Shortness of Breath   Melinda Freeman is a 53 y.o. female.  {Add pertinent medical, surgical, social history, OB history to YEP:67052}  Shortness of Breath Patient presents few different complaints.  Some shortness of breath and nausea that began earlier this morning.  Has recently been diagnosed around a month ago with a Rathke cleft cyst.  Has plans to follow-up with Dr. Malcolm in a week.  Has followed with ophthalmology twice and reportedly had some loss of vision in her peripheral fields although it sounds like they were unsure if it was real or not.  States she will get double vision in her right eye.  Has had episodes today where she will begin to feel bad and her vision get much worse and she will get nauseous.     Prior to Admission medications   Medication Sig Start Date End Date Taking? Authorizing Provider  albuterol  (PROVENTIL  HFA;VENTOLIN  HFA) 108 (90 Base) MCG/ACT inhaler Inhale 1-2 puffs into the lungs every 6 (six) hours as needed for wheezing or shortness of breath. 02/17/17   Pennie Elsie PARAS, FNP  Aspirin-Acetaminophen-Caffeine (EXCEDRIN EXTRA STRENGTH PO) Take 2 tablets by mouth daily as needed (for pain).    [provider]  Collagen-Vitamin C-Biotin (COLLAGEN 1500/C PO) Take 2 tablets by mouth daily.    [provider]  famotidine  (PEPCID ) 40 MG tablet Take 40 mg by mouth daily as needed for indigestion. 09/12/23   [provider]  MAGNESIUM PO Take 500 mg by mouth daily.    [provider]  methocarbamol  (ROBAXIN ) 500 MG tablet Take 1 tablet (500 mg total) by mouth 3 (three) times daily. 12/08/23   [place], Quintin PARAS, MD  polyethylene glycol powder (GLYCOLAX /MIRALAX ) powder Take 17 g by mouth daily. 06/20/15   Denney, Rachelle A, CNM  traZODone  (DESYREL ) 100 MG tablet Take 1 tablet (100  mg total) by mouth at bedtime. 07/27/23   Caudle, Thersia Bitters, FNP  vitamin C (ASCORBIC ACID) 500 MG tablet Take 500 mg by mouth daily.    [provider]  VITAMIN D PO Take 1,000 mg by mouth daily.    [provider]    Allergies: Codeine, Hydrocodone, Percocet [oxycodone-acetaminophen], and Flexeril  [cyclobenzaprine ]    Review of Systems  Respiratory:  Positive for shortness of breath.     Updated Vital Signs BP (!) 180/100   Pulse 74   Temp 98.1 F (36.7 C) (Oral)   Resp 16   SpO2 100%   Physical Exam  (all labs ordered are listed, but only abnormal results are displayed) Labs Reviewed  BASIC METABOLIC PANEL WITH GFR  CBC    EKG: None  Radiology: No results found.  {Document cardiac monitor, telemetry assessment procedure when appropriate:32947} Procedures   Medications Ordered in the ED - No data to display    {Click here for ABCD2, HEART and other calculators REFRESH Note before signing:1}                              Medical Decision Making Amount and/or Complexity of Data Reviewed Labs: ordered. Radiology: ordered.   Patient few different complaints.  Vision changes shortness of breath nausea.  Has known Rathke cleft cyst.  Blood work done and overall reassuring.  Chest x-ray reassuring.  Discussed with Dr.  Malcolm from neurosurgery who patient supposed to see on Monday.  He recommended CT to evaluate for pituitary apoplexy.  If negative for that should be able to discharge home and follow-up on Monday.  {Document critical care time when appropriate  Document review of labs and clinical decision tools ie CHADS2VASC2, etc  Document your independent review of radiology images and any outside records  Document your discussion with family members, caretakers and with consultants  Document social determinants of health affecting pt's care  Document your decision making why or why not admission, treatments were needed:32947:::1}   Final  diagnoses:  None    ED Discharge Orders     None

## 2023-12-27 NOTE — ED Provider Notes (Signed)
  [facility] EMERGENCY DEPARTMENT AT [facility] Provider Assume Care Note I assumed care of Melinda Freeman on 12/27/2023 at 3 PM from Dr. Patsey.   Briefly, Melinda Freeman is a 53 y.o. female who: PMHx: Known Rathke cleft cyst P/w shortness of breath, peripheral vision changes Low index of suspicion for emergent pathology of shortness of breath or nausea Patient is awaiting establishment of care with Dr. Malcolm with neurosurgery for management of Rathke cleft cyst, has upcoming appointment next week, case was discussed with Dr. Malcolm who recommended CT of the head to evaluate for bleeding, and if this was negative, patient can follow-up outpatient in his clinic.  Plan at the time of handoff: CT of the head   Please refer to the original provider's note for additional information regarding the care of Melinda Freeman.  Reassessment: I personally reassessed the patient: Patient without any acute complaints or additional questions.  Vital Signs:  ED Triage Vitals  Encounter Vitals Group     BP 12/27/23 1230 (!) 180/100     Girls Systolic BP Percentile --      Girls Diastolic BP Percentile --      Boys Systolic BP Percentile --      Boys Diastolic BP Percentile --      Pulse Rate 12/27/23 1230 74     Resp 12/27/23 1230 16     Temp 12/27/23 1230 98.1 F (36.7 C)     Temp Source 12/27/23 1230 Oral     SpO2 12/27/23 1230 100 %     Weight --      Height --      Head Circumference --      Peak Flow --      Pain Score 12/27/23 1236 0     Pain Loc --      Pain Education --      Exclude from Growth Chart --      Hemodynamics:  The patient is hemodynamically stable. Mental Status:  The patient is alert  Additional MDM: Patient reports that she has intermittent vision particularly of the right eye which goes along with dizziness.  CT of the head demonstrates stable size of the mass, no bleeding.  Given patient reports that her vision changes continue to be intermittent, there  is no bleeding which was the main concern for Dr. Malcolm per report of Dr. Patsey, and there is no increased size of mass on CT, feel that patient can continue management and have MRI performed outpatient.  Will prescribe meclizine  that patient can use as needed for dizziness.  Discharged with plan for close follow-up with neurosurgery.  Disposition: DISCHARGE: I believe that the patient is safe for discharge home with outpatient follow-up. Patient was informed of all pertinent physical exam, laboratory, and imaging findings. Patient's suspected etiology of their symptom presentation was discussed with the patient and all questions were answered. We discussed following up with neurosurgery, PCP. I provided thorough ED return precautions. The patient feels safe and comfortable with this plan.   Melinda Freeman Later, MD Emergency Medicine    Later Melinda RAMAN, MD 12/27/23 [phone]

## 2024-01-02 ENCOUNTER — Other Ambulatory Visit (HOSPITAL_BASED_OUTPATIENT_CLINIC_OR_DEPARTMENT_OTHER): Payer: Self-pay | Admitting: Family Medicine

## 2024-01-02 DIAGNOSIS — E7800 Pure hypercholesterolemia, unspecified: Secondary | ICD-10-CM

## 2024-01-03 DIAGNOSIS — E237 Disorder of pituitary gland, unspecified: Secondary | ICD-10-CM | POA: Diagnosis not present

## 2024-01-04 ENCOUNTER — Encounter (HOSPITAL_BASED_OUTPATIENT_CLINIC_OR_DEPARTMENT_OTHER): Admitting: Family Medicine

## 2024-01-04 ENCOUNTER — Other Ambulatory Visit (HOSPITAL_BASED_OUTPATIENT_CLINIC_OR_DEPARTMENT_OTHER): Payer: Self-pay | Admitting: Family Medicine

## 2024-01-04 DIAGNOSIS — E237 Disorder of pituitary gland, unspecified: Secondary | ICD-10-CM | POA: Diagnosis not present

## 2024-01-04 LAB — LIPID PANEL
Chol/HDL Ratio: 2.6 ratio (ref 0.0–4.4)
Cholesterol, Total: 253 mg/dL — ABNORMAL HIGH (ref 100–199)
HDL: 99 mg/dL (ref >39)
LDL Chol Calc (NIH): 143 mg/dL — ABNORMAL HIGH (ref 0–99)
Triglycerides: 68 mg/dL (ref 0–149)
VLDL Cholesterol Cal: 11 mg/dL (ref 5–40)

## 2024-01-09 ENCOUNTER — Ambulatory Visit (HOSPITAL_BASED_OUTPATIENT_CLINIC_OR_DEPARTMENT_OTHER): Payer: Self-pay

## 2024-01-09 DIAGNOSIS — Z13220 Encounter for screening for lipoid disorders: Secondary | ICD-10-CM

## 2024-01-09 NOTE — Progress Notes (Signed)
 Can we schedule a nurse lab draw please?   Good Morning Melinda Freeman, In reviewing your repeat lipid labs, while they are slightly lower than when you were in office, they remain elevated. Are you following a strict diet - high protein, low carbs, limiting fried foods, increasing water? If not, I would like for you to begin these diet changes and come back in 1 month for a lab recheck. If levels remain elevated we might need to begin medication. Thanks! Lauraine Norris

## 2024-01-10 DIAGNOSIS — E237 Disorder of pituitary gland, unspecified: Secondary | ICD-10-CM | POA: Diagnosis not present

## 2024-01-27 ENCOUNTER — Other Ambulatory Visit: Payer: Self-pay | Admitting: Neurosurgery

## 2024-01-29 ENCOUNTER — Other Ambulatory Visit (HOSPITAL_BASED_OUTPATIENT_CLINIC_OR_DEPARTMENT_OTHER): Payer: Self-pay | Admitting: Family Medicine

## 2024-02-21 ENCOUNTER — Other Ambulatory Visit (HOSPITAL_BASED_OUTPATIENT_CLINIC_OR_DEPARTMENT_OTHER): Payer: Self-pay | Admitting: Family Medicine

## 2024-02-24 ENCOUNTER — Other Ambulatory Visit (HOSPITAL_BASED_OUTPATIENT_CLINIC_OR_DEPARTMENT_OTHER): Payer: Self-pay | Admitting: Family Medicine

## 2024-04-06 ENCOUNTER — Inpatient Hospital Stay (HOSPITAL_COMMUNITY): Admit: 2024-04-06 | Admitting: Neurosurgery

## 2024-04-06 SURGERY — CRANIOTOMY HYPOPHYSECTOMY TRANSNASAL APPROACH
Anesthesia: General

## 2024-05-28 ENCOUNTER — Inpatient Hospital Stay: Admitting: Hematology and Oncology

## 2024-05-28 ENCOUNTER — Inpatient Hospital Stay

## 2024-12-25 ENCOUNTER — Encounter (HOSPITAL_BASED_OUTPATIENT_CLINIC_OR_DEPARTMENT_OTHER): Admitting: Family Medicine
